# Patient Record
Sex: Female | Born: 1935
Health system: Southern US, Community
[De-identification: ages and names within clinical notes are randomized; demographics above are authoritative.]

## PROBLEM LIST (undated history)

## (undated) DIAGNOSIS — I10 Essential (primary) hypertension: Secondary | ICD-10-CM

## (undated) DIAGNOSIS — R413 Other amnesia: Secondary | ICD-10-CM

## (undated) DIAGNOSIS — K219 Gastro-esophageal reflux disease without esophagitis: Secondary | ICD-10-CM

## (undated) HISTORY — DX: Essential (primary) hypertension: I10

## (undated) HISTORY — DX: Gastro-esophageal reflux disease without esophagitis: K21.9

## (undated) HISTORY — DX: Other amnesia: R41.3

## (undated) HISTORY — PX: CHOLECYSTECTOMY: SHX55

---

## 2004-07-30 ENCOUNTER — Ambulatory Visit: Payer: Self-pay | Admitting: Unknown Physician Specialty

## 2004-08-02 ENCOUNTER — Ambulatory Visit: Payer: Self-pay | Admitting: Unknown Physician Specialty

## 2005-01-03 ENCOUNTER — Ambulatory Visit: Payer: Self-pay | Admitting: Specialist

## 2005-02-21 ENCOUNTER — Ambulatory Visit: Payer: Self-pay | Admitting: Specialist

## 2005-03-27 ENCOUNTER — Ambulatory Visit: Payer: Self-pay | Admitting: General Surgery

## 2005-04-23 ENCOUNTER — Ambulatory Visit: Payer: Self-pay | Admitting: General Surgery

## 2005-05-03 ENCOUNTER — Ambulatory Visit: Payer: Self-pay | Admitting: General Surgery

## 2005-05-10 ENCOUNTER — Ambulatory Visit: Payer: Self-pay | Admitting: General Surgery

## 2005-09-22 ENCOUNTER — Emergency Department: Payer: Self-pay | Admitting: Emergency Medicine

## 2008-04-22 HISTORY — PX: COLONOSCOPY: SHX174

## 2008-06-23 ENCOUNTER — Emergency Department: Payer: Self-pay | Admitting: Emergency Medicine

## 2009-02-28 ENCOUNTER — Ambulatory Visit: Payer: Self-pay | Admitting: Unknown Physician Specialty

## 2009-03-01 ENCOUNTER — Ambulatory Visit: Payer: Self-pay | Admitting: Unknown Physician Specialty

## 2009-08-16 ENCOUNTER — Ambulatory Visit: Payer: Self-pay | Admitting: Family Medicine

## 2009-10-20 ENCOUNTER — Ambulatory Visit: Payer: Self-pay | Admitting: Specialist

## 2010-04-22 HISTORY — PX: HERNIA REPAIR: SHX51

## 2010-05-02 ENCOUNTER — Ambulatory Visit: Payer: Self-pay | Admitting: Specialist

## 2010-08-14 ENCOUNTER — Emergency Department: Payer: Self-pay | Admitting: Emergency Medicine

## 2010-10-28 ENCOUNTER — Observation Stay: Payer: Self-pay | Admitting: Internal Medicine

## 2010-11-08 ENCOUNTER — Ambulatory Visit: Payer: Self-pay | Admitting: General Surgery

## 2010-11-14 ENCOUNTER — Inpatient Hospital Stay: Payer: Self-pay | Admitting: General Surgery

## 2010-11-23 ENCOUNTER — Observation Stay: Payer: Self-pay | Admitting: General Surgery

## 2010-12-10 ENCOUNTER — Ambulatory Visit: Payer: Self-pay | Admitting: General Surgery

## 2012-08-10 ENCOUNTER — Ambulatory Visit: Payer: Self-pay

## 2013-03-15 ENCOUNTER — Ambulatory Visit: Payer: Self-pay | Admitting: Family Medicine

## 2013-03-16 ENCOUNTER — Ambulatory Visit: Payer: Self-pay | Admitting: Internal Medicine

## 2013-12-08 ENCOUNTER — Emergency Department: Payer: Self-pay | Admitting: Internal Medicine

## 2013-12-08 LAB — COMPREHENSIVE METABOLIC PANEL
ALT: 18 U/L
AST: 26 U/L (ref 15–37)
Albumin: 3.5 g/dL (ref 3.4–5.0)
Alkaline Phosphatase: 62 U/L
Anion Gap: 12 (ref 7–16)
BUN: 10 mg/dL (ref 7–18)
Bilirubin,Total: 0.5 mg/dL (ref 0.2–1.0)
CHLORIDE: 103 mmol/L (ref 98–107)
Calcium, Total: 8.3 mg/dL — ABNORMAL LOW (ref 8.5–10.1)
Co2: 27 mmol/L (ref 21–32)
Creatinine: 0.93 mg/dL (ref 0.60–1.30)
EGFR (Non-African Amer.): 57 — ABNORMAL LOW
Glucose: 100 mg/dL — ABNORMAL HIGH (ref 65–99)
Osmolality: 282 (ref 275–301)
Potassium: 3.4 mmol/L — ABNORMAL LOW (ref 3.5–5.1)
Sodium: 142 mmol/L (ref 136–145)
TOTAL PROTEIN: 7 g/dL (ref 6.4–8.2)

## 2013-12-08 LAB — CBC
HCT: 40 % (ref 35.0–47.0)
HGB: 12.4 g/dL (ref 12.0–16.0)
MCH: 25.8 pg — AB (ref 26.0–34.0)
MCHC: 30.9 g/dL — ABNORMAL LOW (ref 32.0–36.0)
MCV: 84 fL (ref 80–100)
PLATELETS: 334 10*3/uL (ref 150–440)
RBC: 4.79 10*6/uL (ref 3.80–5.20)
RDW: 14.3 % (ref 11.5–14.5)
WBC: 7.6 10*3/uL (ref 3.6–11.0)

## 2013-12-08 LAB — URINALYSIS, COMPLETE
BACTERIA: NONE SEEN
BLOOD: NEGATIVE
Bilirubin,UR: NEGATIVE
Glucose,UR: NEGATIVE mg/dL (ref 0–75)
LEUKOCYTE ESTERASE: NEGATIVE
Nitrite: NEGATIVE
PH: 5 (ref 4.5–8.0)
PROTEIN: NEGATIVE
Specific Gravity: 1.019 (ref 1.003–1.030)
Squamous Epithelial: <1

## 2013-12-08 LAB — LIPASE, BLOOD: Lipase: 89 U/L (ref 73–393)

## 2013-12-08 LAB — TROPONIN I

## 2013-12-10 ENCOUNTER — Encounter: Payer: Self-pay | Admitting: General Surgery

## 2013-12-13 ENCOUNTER — Encounter: Payer: Self-pay | Admitting: General Surgery

## 2013-12-13 ENCOUNTER — Ambulatory Visit (INDEPENDENT_AMBULATORY_CARE_PROVIDER_SITE_OTHER): Payer: Medicare Other | Admitting: General Surgery

## 2013-12-13 VITALS — BP 134/78 | HR 70 | Resp 14 | Ht 66.0 in | Wt 194.0 lb

## 2013-12-13 DIAGNOSIS — R112 Nausea with vomiting, unspecified: Secondary | ICD-10-CM

## 2013-12-13 DIAGNOSIS — K5732 Diverticulitis of large intestine without perforation or abscess without bleeding: Secondary | ICD-10-CM

## 2013-12-13 MED ORDER — METOCLOPRAMIDE HCL 5 MG PO TABS: 5.0000 mg | ORAL_TABLET | Freq: Four times a day (QID) | ORAL | Status: DC

## 2013-12-13 NOTE — Patient Instructions (Signed)
Patient to call us on Wednesday to report how she is feeling

## 2013-12-13 NOTE — Progress Notes (Signed)
aPatient ID: Kathy Hernandez, female   DOB: 08/22/1929, 80 y.o.   MRN: [id number]  Chief Complaint  Patient presents with  . Other    Evaluation for diverticulitis    HPI Kathy Hernandez is a 80 y.o. female here today for a evaluation diverticulitis . Patient was seen in the ER on 12/08/13. Patient states she has lost 10 pounds in the last 5 weeks. She sates she is nausea thought out the day and vomits in the morning and dry heaves.  She is accompanied by her close friend, Ms. Edwards.  The patient had not had any significant abdominal complaints since repair of her paraesophageal hernia in 2012. Her recent episode of nausea and vomiting has not been associated with a significant component of abdominal pain.  She was placed on Cipro and Flagyl through the emergency department, which is aggravated her nausea.  Emergency department notes dated December 08, 2013 record a three-week history of intermittent nausea vomiting without abdominal pain or diarrhea. During this time the patient reported she was having normal bowel function.      HPI  Past Medical History  Diagnosis Date  . Hypertension   . GERD (gastroesophageal reflux disease)   . Memory loss     Past Surgical History  Procedure Laterality Date  . Cholecystectomy    . Hernia repair  2012  . Colonoscopy  2010    No family history on file.  Social History History  Substance Use Topics  . Smoking status: Never Smoker   . Smokeless tobacco: Never Used  . Alcohol Use: No    No Known Allergies  Current Outpatient Prescriptions  Medication Sig Dispense Refill  . aspirin 81 MG tablet Take 1 tablet by mouth daily.      . Calcium Carbonate (CALCIUM 600 PO) Take by mouth.      . ciprofloxacin (CIPRO) 500 MG tablet       . donepezil (ARICEPT) 10 MG tablet Take 1 tablet by mouth daily.      Marland Kitchen esomeprazole (NEXIUM) 40 MG capsule Take 1 capsule by mouth daily.      Marland Kitchen lisinopril (PRINIVIL,ZESTRIL) 20 MG tablet       .  metoCLOPramide (REGLAN) 5 MG tablet Take 1 tablet (5 mg total) by mouth 4 (four) times daily.  60 tablet  1  . metroNIDAZOLE (FLAGYL) 250 MG tablet       . NEXIUM 40 MG capsule       . promethazine (PHENERGAN) 25 MG tablet Take 25 mg by mouth every 6 (six) hours as needed for nausea or vomiting.       No current facility-administered medications for this visit.    Review of Systems Review of Systems  Constitutional: Negative.   Respiratory: Negative.   Cardiovascular: Negative.   Gastrointestinal: Positive for nausea, vomiting and diarrhea. Negative for abdominal pain, constipation and anal bleeding.    Blood pressure 134/78, pulse 70, resp. rate 14, height  (1.676 m), weight 194 lb (87.998 kg). Postural blood pressures and pulse were obtained.   In the supine position blood pressure is 132/84 with a pulse of 90.  Seated: 130/80 with a pulse of 103.  Standing 132/80 with a pulse of 94. Minimal postural changes.   The patient's weight at the time of her January 29, 2011 exam was 193 pounds.  Physical Exam Physical Exam  Constitutional: She is oriented to person, place, and time. She appears well-developed and well-nourished.  Eyes: Conjunctivae are normal.  No scleral icterus.  Neck: Neck supple.  Cardiovascular: Normal rate, regular rhythm and normal heart sounds.   Pulmonary/Chest: Effort normal and breath sounds normal.  Abdominal: Soft. Normal appearance and bowel sounds are normal. There is tenderness in the epigastric area.  Neurological: She is alert and oriented to person, place, and time.  Skin: Skin is warm and dry.    Data Reviewed Laboratory studies obtained in the emergency department on December 08, 2013 showed normal liver function studies with a albumin of 3.5, normal alkaline phosphatase. Mild depression of the serum sodium 3.4, mild pressures calcium 8.3, hemoglobin of 12.4 with an MCV of 84 and white blood cell count 7600.  Urinalysis was essentially  negative.  CT scan of the abdomen and pelvis dated December 08, 2013 was reviewed. Very mild sigmoid diverticulitis with fat stranding in the left lower quadrant was appreciated. No evidence of obstruction.  Assessment    Nausea and vomiting without significant findings to suggest diverticulitis on clinical grounds.     Plan    The patient has been asked to discontinue her antibiotics, as they seem to be worsening her nausea. She'll be placed on Reglan, 5 mg 30 minutes before meals and at bedtime. My hope is that with control of her nausea she'll be able to maintain adequate hydration and nutrition without hospitalization.  The patient has been asked to contact the office by new on Wednesday, August 26 to report her progress. If she is not improved will plan for hospital admission.     PCP: Deliah Boston 12/14/2013, 12:52 PM

## 2013-12-14 DIAGNOSIS — K5732 Diverticulitis of large intestine without perforation or abscess without bleeding: Secondary | ICD-10-CM | POA: Insufficient documentation

## 2013-12-14 DIAGNOSIS — R112 Nausea with vomiting, unspecified: Secondary | ICD-10-CM | POA: Insufficient documentation

## 2013-12-15 ENCOUNTER — Telehealth: Payer: Self-pay | Admitting: *Deleted

## 2013-12-15 NOTE — Telephone Encounter (Signed)
Patient has been scheduled for an abdominal ultrasound and UGI/SBFT at [facility] for 12-16-13. This patient is to arrive at 8:45 am and check-in at the [facility] registration desk (first desk on right). Abdominal ultrasound is scheduled for 9 am and UGI/SBFT at 11 am. Prep: NPO after midnight. Patient is aware of date, time, and instructions. She verbalizes understanding.

## 2013-12-15 NOTE — Telephone Encounter (Signed)
Message copied by Currie Paris on Wed Dec 15, 2013 10:12 AM ------      Message from: Earline Mayotte      Created: Tue Dec 14, 2013 12:59 PM       This patient was seen Monday, August 24 with weeks of nausea and vomiting. She has been placed on Reglan and instructed to call by noon on Wednesday, August 26. Please contact her if she is not called by that time. ------

## 2013-12-15 NOTE — Telephone Encounter (Signed)
I talked with the patient. She states she is still nauseated, but no vomiting. She has a poor appetite and eating "very little". Example of yesterday meals, 2-3 crackers for breakfast, 1/2 cheese sandwich for lunch and no supper. She states she feels weak.  I told her I would let Dr. Lemar Livings know and call her back.

## 2013-12-16 ENCOUNTER — Ambulatory Visit: Payer: Self-pay | Admitting: General Surgery

## 2013-12-16 ENCOUNTER — Encounter: Payer: Self-pay | Admitting: General Surgery

## 2013-12-21 ENCOUNTER — Telehealth: Payer: Self-pay | Admitting: *Deleted

## 2013-12-21 NOTE — Telephone Encounter (Signed)
Message copied by Currie Paris on Tue Dec 21, 2013 11:53 AM ------      Message from: Earline Mayotte      Created: Tue Dec 21, 2013  8:59 AM       Notify patient U/S OK, UGI: No blockages.  See how she is doing (has had nausea w/o vomiting).  Arrange f/u in next 1-2 weeks if not significantly improved. Thanks. ------

## 2013-12-21 NOTE — Telephone Encounter (Signed)
Pt is feeling better has her days, I made her a f/u appt in 2 weeks and if she feels even better she will call and cancel. Pt is aware of u/s results

## 2013-12-22 NOTE — Telephone Encounter (Signed)
Notified of results, pt pleased. I talked with the patient and she said that for the past couple of days she has actually felt better. She wasn't sure at what times she was taking the Reglan, so reviewed that with her to take 30 minutes before meals  And then at bedtime, she agrees. F/U has already been scheduled for 01-10-14.

## 2014-01-04 ENCOUNTER — Ambulatory Visit: Payer: Self-pay | Admitting: Family Medicine

## 2014-01-10 ENCOUNTER — Encounter: Payer: Self-pay | Admitting: General Surgery

## 2014-01-10 ENCOUNTER — Ambulatory Visit (INDEPENDENT_AMBULATORY_CARE_PROVIDER_SITE_OTHER): Payer: Medicare Other | Admitting: General Surgery

## 2014-01-10 VITALS — BP 140/80 | HR 78 | Resp 14 | Ht 66.0 in | Wt 195.0 lb

## 2014-01-10 DIAGNOSIS — R112 Nausea with vomiting, unspecified: Secondary | ICD-10-CM

## 2014-01-10 NOTE — Patient Instructions (Addendum)
Patient to decrease Reglan to 3 times a day with meals for three weeks then decrease to 2 times daily then 1 time daily. The patient is aware to call back for any questions or concerns.

## 2014-01-10 NOTE — Progress Notes (Signed)
Patient ID: Kathy Hernandez, female   DOB: 08/10/1929, 80 y.o.   MRN: [id number]  Chief Complaint  Patient presents with  . Follow-up    follow up nausea and vomiting    HPI Kathy Hernandez is a 80 y.o. female who presents for a follow up visit of nausea and vomiting. The patient states she had 1 episode of nausea, vomiting, and diarrhea on 12/30/13. She has completed her antibiotics. She denies pain at this time. Patient states she is avoiding certain foods at this time such as salads. She also states she has been having occasional black stools but this was after the episode of N/V/D treated with Pepto Bismol.  In general, she reports feeling much better.  HPI  Past Medical History  Diagnosis Date  . Hypertension   . GERD (gastroesophageal reflux disease)   . Memory loss     Past Surgical History  Procedure Laterality Date  . Cholecystectomy    . Hernia repair  2012  . Colonoscopy  2010    No family history on file.  Social History History  Substance Use Topics  . Smoking status: Never Smoker   . Smokeless tobacco: Never Used  . Alcohol Use: No    No Known Allergies  Current Outpatient Prescriptions  Medication Sig Dispense Refill  . aspirin 81 MG tablet Take 1 tablet by mouth daily.      . Calcium Carbonate (CALCIUM 600 PO) Take by mouth.      . donepezil (ARICEPT) 10 MG tablet Take 1 tablet by mouth daily.      Marland Kitchen esomeprazole (NEXIUM) 40 MG capsule Take 1 capsule by mouth daily.      Marland Kitchen lisinopril (PRINIVIL,ZESTRIL) 20 MG tablet       . metoCLOPramide (REGLAN) 5 MG tablet Take 1 tablet (5 mg total) by mouth 4 (four) times daily.  60 tablet  1  . NEXIUM 40 MG capsule       . promethazine (PHENERGAN) 25 MG tablet Take 25 mg by mouth every 6 (six) hours as needed for nausea or vomiting.       No current facility-administered medications for this visit.    Review of Systems Review of Systems  Constitutional: Negative.   Respiratory: Negative.   Cardiovascular:  Negative.   Gastrointestinal: Negative.     Blood pressure 140/80, pulse 78, resp. rate 14, height  (1.676 m), weight 195 lb (88.451 kg). The patient's weight is up 1 pound from her last visit.  Physical Exam Physical Exam  Constitutional: She is oriented to person, place, and time. She appears well-developed and well-nourished.  Cardiovascular: Normal rate, regular rhythm and normal heart sounds.   No murmur heard. Pulmonary/Chest: Effort normal and breath sounds normal.  Abdominal: Soft. Normal appearance and bowel sounds are normal. There is no hepatosplenomegaly. There is no tenderness. No hernia.  Neurological: She is alert and oriented to person, place, and time.  Skin: Skin is warm and dry.    Data Reviewed Upper GI series showed no evidence of obstruction. Upper abdominal ultrasound was negative. (Status postcholecystectomy).  Assessment    Marked improvement in postprandial abdominal symptoms. Etiology unclear.     Plan    The patient will call should she experience new problems, follow up otherwise will be on an as needed basis.      PCP: Deliah Boston 01/11/2014, 6:52 AM

## 2014-02-08 ENCOUNTER — Telehealth: Payer: Self-pay

## 2014-02-08 NOTE — Telephone Encounter (Signed)
The patient had been instructed to wean off of her Reglan at the time of her September 2015 visit. Apparently this is not going to be entirely possible. She'll be placed on Reglan, 5 mg T.i.d. Until her symptoms have resolved, and then decrease to a b.i.d. Schedule indefinitely.

## 2014-02-08 NOTE — Telephone Encounter (Signed)
Patient called and said that she had used all the stomach nausea medication as prescribed and had done well with the reduction. She has been out for the past 3 days and now today is having more nausea and would like a refill of the Reglan that was prescribed.

## 2014-02-09 MED ORDER — METOCLOPRAMIDE HCL 5 MG PO TABS: 5.0000 mg | ORAL_TABLET | Freq: Three times a day (TID) | ORAL | Status: DC

## 2014-02-09 NOTE — Addendum Note (Signed)
Addended by: Currie ParisHATCH, Jermario Kalmar M on: 02/09/2014 11:29 AM   Modules accepted: Orders

## 2014-02-09 NOTE — Telephone Encounter (Signed)
Pt aware of RX and instructions.

## 2015-04-26 ENCOUNTER — Emergency Department: Payer: PPO

## 2015-04-26 ENCOUNTER — Emergency Department
Admission: EM | Admit: 2015-04-26 | Discharge: 2015-04-26 | Disposition: A | Discharge status: Home / Self Care | Payer: PPO | Attending: Emergency Medicine | Admitting: Emergency Medicine

## 2015-04-26 ENCOUNTER — Encounter: Payer: Self-pay | Admitting: Emergency Medicine

## 2015-04-26 DIAGNOSIS — I10 Essential (primary) hypertension: Secondary | ICD-10-CM | POA: Insufficient documentation

## 2015-04-26 DIAGNOSIS — N3090 Cystitis, unspecified without hematuria: Secondary | ICD-10-CM | POA: Diagnosis not present

## 2015-04-26 DIAGNOSIS — R410 Disorientation, unspecified: Secondary | ICD-10-CM | POA: Diagnosis not present

## 2015-04-26 DIAGNOSIS — Z79899 Other long term (current) drug therapy: Secondary | ICD-10-CM | POA: Insufficient documentation

## 2015-04-26 DIAGNOSIS — R4182 Altered mental status, unspecified: Secondary | ICD-10-CM | POA: Diagnosis not present

## 2015-04-26 DIAGNOSIS — F0390 Unspecified dementia without behavioral disturbance: Secondary | ICD-10-CM | POA: Insufficient documentation

## 2015-04-26 DIAGNOSIS — R4781 Slurred speech: Secondary | ICD-10-CM | POA: Diagnosis not present

## 2015-04-26 DIAGNOSIS — Z7982 Long term (current) use of aspirin: Secondary | ICD-10-CM | POA: Insufficient documentation

## 2015-04-26 LAB — COMPREHENSIVE METABOLIC PANEL
ALK PHOS: 64 U/L (ref 38–126)
ALT: 10 U/L — AB (ref 14–54)
ANION GAP: 7 (ref 5–15)
AST: 16 U/L (ref 15–41)
Albumin: 4 g/dL (ref 3.5–5.0)
BILIRUBIN TOTAL: 1 mg/dL (ref 0.3–1.2)
BUN: 13 mg/dL (ref 6–20)
CALCIUM: 9.3 mg/dL (ref 8.9–10.3)
CO2: 28 mmol/L (ref 22–32)
CREATININE: 0.84 mg/dL (ref 0.44–1.00)
Chloride: 103 mmol/L (ref 101–111)
GFR calc non Af Amer: >60 mL/min (ref >60)
Glucose, Bld: 109 mg/dL — ABNORMAL HIGH (ref 65–99)
Potassium: 4 mmol/L (ref 3.5–5.1)
SODIUM: 138 mmol/L (ref 135–145)
TOTAL PROTEIN: 7 g/dL (ref 6.5–8.1)

## 2015-04-26 LAB — URINALYSIS COMPLETE WITH MICROSCOPIC (ARMC ONLY)
Bilirubin Urine: NEGATIVE
GLUCOSE, UA: NEGATIVE mg/dL
NITRITE: NEGATIVE
PROTEIN: NEGATIVE mg/dL
Specific Gravity, Urine: 1.014 (ref 1.005–1.030)
pH: 6 (ref 5.0–8.0)

## 2015-04-26 LAB — PROTIME-INR
INR: 1.01
PROTHROMBIN TIME: 13.5 s (ref 11.4–15.0)

## 2015-04-26 LAB — DIFFERENTIAL
Basophils Absolute: 0.1 10*3/uL (ref 0–0.1)
Basophils Relative: 1 %
EOS PCT: 1 %
Eosinophils Absolute: 0.1 10*3/uL (ref 0–0.7)
LYMPHS ABS: 1.4 10*3/uL (ref 1.0–3.6)
LYMPHS PCT: 17 %
MONO ABS: 0.2 10*3/uL (ref 0.2–0.9)
MONOS PCT: 3 %
NEUTROS ABS: 6.1 10*3/uL (ref 1.4–6.5)
Neutrophils Relative %: 78 %

## 2015-04-26 LAB — CBC
HEMATOCRIT: 39.4 % (ref 35.0–47.0)
HEMOGLOBIN: 12.9 g/dL (ref 12.0–16.0)
MCH: 26.3 pg (ref 26.0–34.0)
MCHC: 32.7 g/dL (ref 32.0–36.0)
MCV: 80.5 fL (ref 80.0–100.0)
PLATELETS: 295 10*3/uL (ref 150–440)
RBC: 4.89 MIL/uL (ref 3.80–5.20)
RDW: 14.4 % (ref 11.5–14.5)
WBC: 7.9 10*3/uL (ref 3.6–11.0)

## 2015-04-26 LAB — APTT: aPTT: 30 seconds (ref 24–36)

## 2015-04-26 LAB — TROPONIN I: Troponin I: <0.03 ng/mL (ref <0.031)

## 2015-04-26 MED ORDER — ONDANSETRON HCL 4 MG/2ML IJ SOLN
4.0000 mg | Freq: Once | INTRAMUSCULAR | Status: AC
  Administered 2015-04-26: 4 mg via INTRAVENOUS

## 2015-04-26 MED ORDER — SODIUM CHLORIDE 0.9 % IV BOLUS (SEPSIS)
1000.0000 mL | Freq: Once | INTRAVENOUS | Status: AC
  Administered 2015-04-26: 1000 mL via INTRAVENOUS

## 2015-04-26 MED ORDER — CEPHALEXIN 500 MG PO CAPS: 500.0000 mg | ORAL_CAPSULE | Freq: Three times a day (TID) | ORAL | Status: DC

## 2015-04-26 MED ORDER — ACETAMINOPHEN 325 MG PO TABS
650.0000 mg | ORAL_TABLET | Freq: Once | ORAL | Status: AC
  Administered 2015-04-26: 650 mg via ORAL
  Filled 2015-04-26: qty 2

## 2015-04-26 MED ORDER — ONDANSETRON HCL 4 MG/2ML IJ SOLN
INTRAMUSCULAR | Status: AC
  Administered 2015-04-26: 4 mg via INTRAVENOUS
  Filled 2015-04-26: qty 2

## 2015-04-26 NOTE — ED Notes (Signed)
Pt from home via [facility] with possible stroke, EMS reports negative stroke test. VSS 189/112, HR 79, 96% RA, CBG 101. Pt reports slurred speech and confusion, LKW was 2300 yesterday. Pt alert and oriented upon arrival to ED. MD to bedside.

## 2015-04-26 NOTE — ED Provider Notes (Signed)
[facility] Emergency Department Provider Note  ____________________________________________  Time seen: 9:05 am  I have reviewed the triage vital signs and the nursing notes.   HISTORY  Chief Complaint Stroke Symptoms  history Limited by chronic dementia patient inability to relate a history   HPI Kathy Hernandez is a 82 y.o. female sent to the ED due to some confusion this morning. The patient is unable to report any symptoms with states that she does feel sick. Family report that she gets confused time to time and has chronic dementia, but this morning she seemed a little bit more confused than usual. No weakness or falls. Report that she may have set had some slurred speech but that was resolved on arrival by EMS according to EMS.     Past Medical History  Diagnosis Date  . Hypertension   . GERD (gastroesophageal reflux disease)   . Memory loss      Patient Active Problem List   Diagnosis Date Noted  . Nausea with vomiting 12/14/2013  . Diverticulitis of colon (without mention of hemorrhage) 12/14/2013     Past Surgical History  Procedure Laterality Date  . Cholecystectomy    . Hernia repair  2012  . Colonoscopy  2010     Current Outpatient Rx  Name  Route  Sig  Dispense  Refill  . aspirin 81 MG tablet   Oral   Take 1 tablet by mouth daily.         . Calcium Carbonate (CALCIUM 600 PO)   Oral   Take 1 tablet by mouth daily.          Marland Kitchen. lisinopril (PRINIVIL,ZESTRIL) 20 MG tablet   Oral   Take 20 mg by mouth daily.          . memantine (NAMENDA) 5 MG tablet   Oral   Take 5 mg by mouth 2 (two) times daily.         . metoCLOPramide (REGLAN) 5 MG tablet   Oral   Take 1 tablet (5 mg total) by mouth 3 (three) times daily before meals.   90 tablet   11   . cephALEXin (KEFLEX) 500 MG capsule   Oral   Take 1 capsule (500 mg total) by mouth 3 (three) times daily.   21 capsule   0   . metoCLOPramide (REGLAN) 5 MG  tablet   Oral   Take 1 tablet (5 mg total) by mouth 4 (four) times daily. Patient not taking: Reported on 04/26/2015   60 tablet   1      Allergies Review of patient's allergies indicates no known allergies.   No family history on file.  Social History Social History  Substance Use Topics  . Smoking status: Never Smoker   . Smokeless tobacco: Never Used  . Alcohol Use: No    Review of Systems  Constitutional:   No fever or chills. No weight changes Eyes:   No blurry vision or double vision.  ENT:   No sore throat. Cardiovascular:   No chest pain. Respiratory:   No dyspnea or cough. Gastrointestinal:   Negative for abdominal pain, vomiting and diarrhea.  No BRBPR or melena. Genitourinary:   Negative for dysuria, urinary retention, bloody urine, or difficulty urinating. Musculoskeletal:   Negative for back pain. No joint swelling or pain. Skin:   Negative for rash. Neurological:   Negative for headaches, focal weakness or numbness. Psychiatric:  No anxiety or depression.   Endocrine:  No hot/cold intolerance, changes in energy, or sleep difficulty.  10-point ROS otherwise negative.  ____________________________________________   PHYSICAL EXAM:  VITAL SIGNS: ED Triage Vitals  Enc Vitals Group     BP --      Pulse --      Resp --      Temp 04/26/15 0907 99.4 F (37.4 C)     Temp Source 04/26/15 0907 Oral     SpO2 --      Weight 04/26/15 0907 190 lb (86.183 kg)     Height 04/26/15 0907 5\' 6"  (1.676 m)     Head Cir --      Peak Flow --      Pain Score 04/26/15 0907 0     Pain Loc --      Pain Edu? --      Excl. in GC? --     Vital signs reviewed, nursing assessments reviewed.   Constitutional:   Alert and oriented 3. Well appearing and in no distress. Eyes:   No scleral icterus. No conjunctival pallor. PERRL. EOMI ENT   Head:   Normocephalic and atraumatic.   Nose:   No congestion/rhinnorhea. No septal hematoma   Mouth/Throat:   Dry mucous  membranes, no pharyngeal erythema. No peritonsillar mass. No uvula shift.   Neck:   No stridor. No SubQ emphysema. No meningismus. Hematological/Lymphatic/Immunilogical:   No cervical lymphadenopathy. Cardiovascular:   RRR. Normal and symmetric distal pulses are present in all extremities. No murmurs, rubs, or gallops. Respiratory:   Normal respiratory effort without tachypnea nor retractions. Breath sounds are clear and equal bilaterally. No wheezes/rales/rhonchi. Gastrointestinal:   Soft and nontender. No distention. There is no CVA tenderness.  No rebound, rigidity, or guarding. Genitourinary:   deferred Musculoskeletal:   Nontender with normal range of motion in all extremities. No joint effusions.  No lower extremity tenderness.  No edema. Neurologic:   Normal speech and language.  CN 2-10 normal. Motor grossly intact. No pronator drift. No gross focal neurologic deficits are appreciated.  Skin:    Skin is warm, dry and intact. No rash noted.  No petechiae, purpura, or bullae. Psychiatric:   Mood and affect are normal. Speech and behavior are normal. Patient exhibits appropriate insight and judgment.  ____________________________________________    LABS (pertinent positives/negatives) (all labs ordered are listed, but only abnormal results are displayed) Labs Reviewed  COMPREHENSIVE METABOLIC PANEL - Abnormal; Notable for the following:    Glucose, Bld 109 (*)    ALT 10 (*)    All other components within normal limits  URINALYSIS COMPLETEWITH MICROSCOPIC ([facility] ONLY) - Abnormal; Notable for the following:    Color, Urine YELLOW (*)    APPearance HAZY (*)    Ketones, ur TRACE (*)    Hgb urine dipstick 1+ (*)    Leukocytes, UA 2+ (*)    Bacteria, UA MANY (*)    Squamous Epithelial / LPF 0-5 (*)    All other components within normal limits  URINE CULTURE  PROTIME-INR  APTT  CBC  DIFFERENTIAL  TROPONIN I  CBG MONITORING, ED    ____________________________________________   EKG  Interpreted by me Normal sinus rhythm rate of 76, normal axis intervals QRS and ST segments and T waves  ____________________________________________    RADIOLOGY  CT head unremarkable   ____________________________________________   PROCEDURES   ____________________________________________   INITIAL IMPRESSION / ASSESSMENT AND PLAN / ED COURSE  Pertinent labs & imaging results that were available during my care of  the patient were reviewed by me and considered in my medical decision making (see chart for details).  Patient presents with some confusion in setting of chronic dementia, now back to her baseline on arrival to the ED according to family who arrived shortly thereafter. Low suspicion for TIA stroke or intracranial hemorrhage. No evidence of sepsis. We'll check CT and labs.  ----------------------------------------- 12:09 PM on 04/26/2015 -----------------------------------------  CT negative, labs unremarkable except for a yacht urinalysis showing 2+ leukocytes and a large amount of bacteria. With her low-grade fever of 99, she may be having some mild delirium or exacerbation of her dementia in the setting of an oncoming urinary tract infection. I sent urine culture and will start her on Keflex and have her follow up with primary care. She is otherwise well-appearing no acute distress nontoxic and suitable for discharge and outpatient follow-up.     ____________________________________________   FINAL CLINICAL IMPRESSION(S) / ED DIAGNOSES  Final diagnoses:  Confusion  Cystitis      Sharman Cheek, MD 04/26/15 1209

## 2015-04-26 NOTE — ED Notes (Signed)
Pt discharged home after verbalizing understanding of discharge instructions; nad noted. 

## 2015-04-26 NOTE — Discharge Instructions (Signed)
Confusion °Confusion is the inability to think with your usual speed or clarity. Confusion may come on quickly or slowly over time. How quickly the confusion comes on depends on the cause. Confusion can be due to any number of causes. °CAUSES  °· Concussion, head injury, or head trauma. °· Seizures. °· Stroke. °· Fever. °· Brain tumor. °· Age related decreased brain function (dementia). °· Heightened emotional states like rage or terror. °· Mental illness in which the person loses the ability to determine what is real and what is not (hallucinations). °· Infections such as a urinary tract infection (UTI). °· Toxic effects from alcohol, drugs, or prescription medicines. °· Dehydration and an imbalance of salts in the body (electrolytes). °· Lack of sleep. °· Low blood sugar (diabetes). °· Low levels of oxygen from conditions such as chronic lung disorders. °· Drug interactions or other medicine side effects. °· Nutritional deficiencies, especially niacin, thiamine, vitamin C, or vitamin B. °· Sudden drop in body temperature (hypothermia). °· Change in routine, such as when traveling or hospitalized. °SIGNS AND SYMPTOMS  °People often describe their thinking as cloudy or unclear when they are confused. Confusion can also include feeling disoriented. That means you are unaware of where or who you are. You may also not know what the date or time is. If confused, you may also have difficulty paying attention, remembering, and making decisions. Some people also act aggressively when they are confused.  °DIAGNOSIS  °The medical evaluation of confusion may include: °· Blood and urine tests. °· X-rays. °· Brain and nervous system tests. °· Analyzing your brain waves (electroencephalogram or EEG). °· Magnetic resonance imaging (MRI) of your head. °· Computed tomography (CT) scan of your head. °· Mental status tests in which your health care provider may ask many questions. Some of these questions may seem silly or strange,  but they are a very important test to help diagnose and treat confusion. °TREATMENT  °An admission to the hospital may not be needed, but a person with confusion should not be left alone. Stay with a family member or friend until the confusion clears. Avoid alcohol, pain relievers, or sedative drugs until you have fully recovered. Do not drive until directed by your health care provider. °HOME CARE INSTRUCTIONS  °What family and friends can do: °· To find out if someone is confused, ask the person to state his or her name, age, and the date. If the person is unsure or answers incorrectly, he or she is confused. °· Always introduce yourself, no matter how well the person knows you. °· Often remind the person of his or her location. °· Place a calendar and clock near the confused person. °· Help the person with his or her medicines. You may want to use a pill box, an alarm as a reminder, or give the person each dose as prescribed. °· Talk about current events and plans for the day. °· Try to keep the environment calm, quiet, and peaceful. °· Make sure the person keeps follow-up visits with his or her health care provider. °PREVENTION  °Ways to prevent confusion: °· Avoid alcohol. °· Eat a balanced diet. °· Get enough sleep. °· Take medicine only as directed by your health care provider. °· Do not become isolated. Spend time with other people and make plans for your days. °· Keep careful watch on your blood sugar levels if you are diabetic. °SEEK IMMEDIATE MEDICAL CARE IF:  °· You develop severe headaches, repeated vomiting, seizures, blackouts, or   slurred speech. °· There is increasing confusion, weakness, numbness, restlessness, or personality changes. °· You develop a loss of balance, have marked dizziness, feel uncoordinated, or fall. °· You have delusions, hallucinations, or develop severe anxiety. °· Your family members think you need to be rechecked. °  °This information is not intended to replace advice given  to you by your health care provider. Make sure you discuss any questions you have with your health care provider. °  °Document Released: 05/16/2004 Document Revised: 04/29/2014 Document Reviewed: 05/14/2013 °Elsevier Interactive Patient Education ©2016 [employer] °Urinary Tract Infection °Urinary tract infections (UTIs) can develop anywhere along your urinary tract. Your urinary tract is your body's drainage system for removing wastes and extra water. Your urinary tract includes two kidneys, two ureters, a bladder, and a urethra. Your kidneys are a pair of bean-shaped organs. Each kidney is about the size of your fist. They are located below your ribs, one on each side of your spine. °CAUSES °Infections are caused by microbes, which are microscopic organisms, including fungi, viruses, and bacteria. These organisms are so small that they can only be seen through a microscope. Bacteria are the microbes that most commonly cause UTIs. °SYMPTOMS  °Symptoms of UTIs may vary by age and gender of the patient and by the location of the infection. Symptoms in young women typically include a frequent and intense urge to urinate and a painful, burning feeling in the bladder or urethra during urination. Older women and men are more likely to be tired, shaky, and weak and have muscle aches and abdominal pain. A fever may mean the infection is in your kidneys. Other symptoms of a kidney infection include pain in your back or sides below the ribs, nausea, and vomiting. °DIAGNOSIS °To diagnose a UTI, your caregiver will ask you about your symptoms. Your caregiver will also ask you to provide a urine sample. The urine sample will be tested for bacteria and white blood cells. White blood cells are made by your body to help fight infection. °TREATMENT  °Typically, UTIs can be treated with medication. Because most UTIs are caused by a bacterial infection, they usually can be treated with the use of antibiotics. The choice of  antibiotic and length of treatment depend on your symptoms and the type of bacteria causing your infection. °HOME CARE INSTRUCTIONS °· If you were prescribed antibiotics, take them exactly as your caregiver instructs you. Finish the medication even if you feel better after you have only taken some of the medication. °· Drink enough water and fluids to keep your urine clear or pale yellow. °· Avoid caffeine, tea, and carbonated beverages. They tend to irritate your bladder. °· Empty your bladder often. Avoid holding urine for long periods of time. °· Empty your bladder before and after sexual intercourse. °· After a bowel movement, women should cleanse from front to back. Use each tissue only once. °SEEK MEDICAL CARE IF:  °· You have back pain. °· You develop a fever. °· Your symptoms do not begin to resolve within 3 days. °SEEK IMMEDIATE MEDICAL CARE IF:  °· You have severe back pain or lower abdominal pain. °· You develop chills. °· You have nausea or vomiting. °· You have continued burning or discomfort with urination. °MAKE SURE YOU:  °· Understand these instructions. °· Will watch your condition. °· Will get help right away if you are not doing well or get worse. °  °This information is not intended to replace advice given to you by   your health care provider. Make sure you discuss any questions you have with your health care provider. °  °Document Released: 01/16/2005 Document Revised: 12/28/2014 Document Reviewed: 05/17/2011 °Elsevier Interactive Patient Education ©2016 [employer] ° °

## 2015-04-28 LAB — URINE CULTURE: Culture: >=100000

## 2015-05-04 DIAGNOSIS — Z87440 Personal history of urinary (tract) infections: Secondary | ICD-10-CM | POA: Diagnosis not present

## 2015-05-04 DIAGNOSIS — G308 Other Alzheimer's disease: Secondary | ICD-10-CM | POA: Diagnosis not present

## 2015-05-04 DIAGNOSIS — E538 Deficiency of other specified B group vitamins: Secondary | ICD-10-CM | POA: Diagnosis not present

## 2015-05-04 DIAGNOSIS — F0280 Dementia in other diseases classified elsewhere without behavioral disturbance: Secondary | ICD-10-CM | POA: Diagnosis not present

## 2015-05-05 ENCOUNTER — Emergency Department
Admission: EM | Admit: 2015-05-05 | Discharge: 2015-05-05 | Disposition: A | Discharge status: Home / Self Care | Payer: PPO | Attending: Emergency Medicine | Admitting: Emergency Medicine

## 2015-05-05 ENCOUNTER — Emergency Department: Payer: PPO

## 2015-05-05 ENCOUNTER — Encounter: Payer: Self-pay | Admitting: Emergency Medicine

## 2015-05-05 DIAGNOSIS — R51 Headache: Secondary | ICD-10-CM | POA: Diagnosis not present

## 2015-05-05 DIAGNOSIS — Z7982 Long term (current) use of aspirin: Secondary | ICD-10-CM | POA: Diagnosis not present

## 2015-05-05 DIAGNOSIS — R4781 Slurred speech: Secondary | ICD-10-CM | POA: Diagnosis not present

## 2015-05-05 DIAGNOSIS — I10 Essential (primary) hypertension: Secondary | ICD-10-CM | POA: Insufficient documentation

## 2015-05-05 DIAGNOSIS — G459 Transient cerebral ischemic attack, unspecified: Secondary | ICD-10-CM | POA: Insufficient documentation

## 2015-05-05 DIAGNOSIS — R29700 NIHSS score 0: Secondary | ICD-10-CM | POA: Diagnosis not present

## 2015-05-05 DIAGNOSIS — Z792 Long term (current) use of antibiotics: Secondary | ICD-10-CM | POA: Diagnosis not present

## 2015-05-05 DIAGNOSIS — R29810 Facial weakness: Secondary | ICD-10-CM

## 2015-05-05 DIAGNOSIS — Z79899 Other long term (current) drug therapy: Secondary | ICD-10-CM | POA: Insufficient documentation

## 2015-05-05 DIAGNOSIS — N390 Urinary tract infection, site not specified: Secondary | ICD-10-CM | POA: Insufficient documentation

## 2015-05-05 DIAGNOSIS — I639 Cerebral infarction, unspecified: Secondary | ICD-10-CM

## 2015-05-05 LAB — CBC
HEMATOCRIT: 38.2 % (ref 35.0–47.0)
Hemoglobin: 12.2 g/dL (ref 12.0–16.0)
MCH: 26.5 pg (ref 26.0–34.0)
MCHC: 32 g/dL (ref 32.0–36.0)
MCV: 82.7 fL (ref 80.0–100.0)
PLATELETS: 281 10*3/uL (ref 150–440)
RBC: 4.62 MIL/uL (ref 3.80–5.20)
RDW: 15 % — AB (ref 11.5–14.5)
WBC: 8 10*3/uL (ref 3.6–11.0)

## 2015-05-05 LAB — DIFFERENTIAL
BASOS ABS: 0 10*3/uL (ref 0–0.1)
BASOS PCT: 1 %
Eosinophils Absolute: 0.2 10*3/uL (ref 0–0.7)
Eosinophils Relative: 2 %
Lymphocytes Relative: 27 %
Lymphs Abs: 2.1 10*3/uL (ref 1.0–3.6)
MONOS PCT: 5 %
Monocytes Absolute: 0.4 10*3/uL (ref 0.2–0.9)
NEUTROS ABS: 5.2 10*3/uL (ref 1.4–6.5)
Neutrophils Relative %: 65 %

## 2015-05-05 LAB — COMPREHENSIVE METABOLIC PANEL
ALT: 11 U/L — AB (ref 14–54)
AST: 12 U/L — AB (ref 15–41)
Albumin: 3.5 g/dL (ref 3.5–5.0)
Alkaline Phosphatase: 53 U/L (ref 38–126)
Anion gap: 7 (ref 5–15)
BUN: 16 mg/dL (ref 6–20)
CHLORIDE: 104 mmol/L (ref 101–111)
CO2: 29 mmol/L (ref 22–32)
CREATININE: 0.93 mg/dL (ref 0.44–1.00)
Calcium: 9.3 mg/dL (ref 8.9–10.3)
GFR calc Af Amer: >60 mL/min (ref >60)
GFR calc non Af Amer: 55 mL/min — ABNORMAL LOW (ref >60)
Glucose, Bld: 80 mg/dL (ref 65–99)
Potassium: 3.3 mmol/L — ABNORMAL LOW (ref 3.5–5.1)
SODIUM: 140 mmol/L (ref 135–145)
Total Bilirubin: 0.4 mg/dL (ref 0.3–1.2)
Total Protein: 6.4 g/dL — ABNORMAL LOW (ref 6.5–8.1)

## 2015-05-05 LAB — URINALYSIS COMPLETE WITH MICROSCOPIC (ARMC ONLY)
BILIRUBIN URINE: NEGATIVE
Glucose, UA: NEGATIVE mg/dL
HGB URINE DIPSTICK: NEGATIVE
Nitrite: POSITIVE — AB
PH: 5 (ref 5.0–8.0)
Protein, ur: 30 mg/dL — AB
Specific Gravity, Urine: 1.032 — ABNORMAL HIGH (ref 1.005–1.030)
TRANS EPITHEL UA: 1

## 2015-05-05 LAB — ETHANOL

## 2015-05-05 LAB — PROTIME-INR
INR: 1.01
Prothrombin Time: 13.5 seconds (ref 11.4–15.0)

## 2015-05-05 LAB — TROPONIN I: Troponin I: <0.03 ng/mL (ref <0.031)

## 2015-05-05 LAB — GLUCOSE, CAPILLARY: GLUCOSE-CAPILLARY: 100 mg/dL — AB (ref 65–99)

## 2015-05-05 LAB — APTT: APTT: 26 s (ref 24–36)

## 2015-05-05 MED ORDER — LORAZEPAM 2 MG/ML IJ SOLN
INTRAMUSCULAR | Status: AC
  Administered 2015-05-05: 1 mg via INTRAVENOUS
  Filled 2015-05-05: qty 1

## 2015-05-05 MED ORDER — LORAZEPAM 2 MG/ML IJ SOLN
1.0000 mg | Freq: Once | INTRAMUSCULAR | Status: AC
  Administered 2015-05-05: 1 mg via INTRAVENOUS

## 2015-05-05 MED ORDER — CEFUROXIME AXETIL 500 MG PO TABS
500.0000 mg | ORAL_TABLET | Freq: Once | ORAL | Status: DC
  Filled 2015-05-05: qty 1

## 2015-05-05 MED ORDER — ASPIRIN 81 MG PO CHEW
324.0000 mg | CHEWABLE_TABLET | Freq: Once | ORAL | Status: AC
  Administered 2015-05-05: 324 mg via ORAL
  Filled 2015-05-05: qty 4

## 2015-05-05 MED ORDER — CEFUROXIME AXETIL 500 MG PO TABS
500.0000 mg | ORAL_TABLET | Freq: Once | ORAL | Status: AC
  Administered 2015-05-05: 500 mg via ORAL
  Filled 2015-05-05: qty 1

## 2015-05-05 MED ORDER — CEFUROXIME AXETIL 500 MG PO TABS: 500.0000 mg | ORAL_TABLET | Freq: Two times a day (BID) | ORAL | Status: AC

## 2015-05-05 NOTE — ED Notes (Signed)
CBG 100 

## 2015-05-05 NOTE — ED Notes (Signed)
Right facial droop, right side headache, slurring words, drooling from left side of mouth. Family /friends witnessed symptoms at 14:55

## 2015-05-05 NOTE — ED Notes (Signed)
MD at bedside. 

## 2015-05-05 NOTE — ED Notes (Signed)
approx 15 min PTA , family /friends noticed right facial droop, slurring speech, left side mouth drooling, right sided headache.

## 2015-05-05 NOTE — ED Provider Notes (Addendum)
[facility]  Emergency Department Provider Note  ____________________________________________  Time seen: Seen upon arrival to the treatment area  I have reviewed the triage vital signs and the nursing notes.   HISTORY  Chief Complaint Code Stroke    HPI Kathy Hernandez is a 82 y.o. female with a history of hypertension as well as the early stages of dementia who is presenting today with a right-sided facial droop and right-sided headache and slurring words and started about 2:55 PM today. It was witnessed by 2 friends. At the time of this interview they say that the patient does not necessarily have a facial droop but is "having 1000 stare." They say that she has not been eating well all week and last week was diagnosed with urinary tract infection here in the emergency department. Although they say she was cleared yesterday with a repeat urinalysis.  At this time the patient says that her headache has resolved. She denies any weakness or numbness.No history of recent falls.   Past Medical History  Diagnosis Date  . Hypertension   . GERD (gastroesophageal reflux disease)   . Memory loss     Patient Active Problem List   Diagnosis Date Noted  . Nausea with vomiting 12/14/2013  . Diverticulitis of colon (without mention of hemorrhage) 12/14/2013    Past Surgical History  Procedure Laterality Date  . Cholecystectomy    . Hernia repair  2012  . Colonoscopy  2010    Current Outpatient Rx  Name  Route  Sig  Dispense  Refill  . aspirin EC 81 MG tablet   Oral   Take 81 mg by mouth daily.         . calcium carbonate (OSCAL) 1500 (600 Ca) MG TABS tablet   Oral   Take 600 mg of elemental calcium by mouth daily.         . cholecalciferol (VITAMIN D) 1000 units tablet   Oral   Take 1,000 Units by mouth daily.         . cyanocobalamin (,VITAMIN B-12,) 1000 MCG/ML injection   Intramuscular   Inject 1,000 mcg into the muscle every 30 (thirty)  days.         Marland Kitchen. esomeprazole (NEXIUM) 40 MG capsule   Oral   Take 40 mg by mouth daily at 12 noon.         Marland Kitchen. lisinopril (PRINIVIL,ZESTRIL) 20 MG tablet   Oral   Take 20 mg by mouth daily.          . memantine (NAMENDA) 5 MG tablet   Oral   Take 5 mg by mouth 2 (two) times daily.         . Multiple Vitamin (MULTIVITAMIN WITH MINERALS) TABS tablet   Oral   Take 1 tablet by mouth daily.         . cephALEXin (KEFLEX) 500 MG capsule   Oral   Take 1 capsule (500 mg total) by mouth 3 (three) times daily. Patient not taking: Reported on 05/05/2015   21 capsule   0     Allergies Review of patient's allergies indicates no known allergies.  No family history on file.  Social History Social History  Substance Use Topics  . Smoking status: Never Smoker   . Smokeless tobacco: Never Used  . Alcohol Use: No    Review of Systems Constitutional: No fever/chills Eyes: No visual changes. ENT: No sore throat. Cardiovascular: Denies chest pain. Respiratory: Denies shortness of breath.  Gastrointestinal: No abdominal pain.  No nausea, no vomiting.  No diarrhea.  No constipation. Genitourinary: Negative for dysuria. Musculoskeletal: Negative for back pain. Skin: Negative for rash. Neurological: Negative for numbness.  10-point ROS otherwise negative.  ____________________________________________   PHYSICAL EXAM:  VITAL SIGNS: ED Triage Vitals  Enc Vitals Group     BP --      Pulse --      Resp --      Temp --      Temp src --      SpO2 --      Weight 05/05/15 1527 185 lb 8 oz (84.142 kg)     Height 05/05/15 1527 5\' 6"  (1.676 m)     Head Cir --      Peak Flow --      Pain Score 05/05/15 1527 4     Pain Loc --      Pain Edu? --      Excl. in GC? --     Constitutional: Alert and oriented. Well appearing and in no acute distress. Eyes: Conjunctivae are normal. PERRL. EOMI. Head: Atraumatic. Nose: No congestion/rhinnorhea. Mouth/Throat: Mucous membranes  are moist.  Oropharynx non-erythematous. Neck: No stridor.   Cardiovascular: Normal rate, regular rhythm. Grossly normal heart sounds.  Good peripheral circulation. Respiratory: Normal respiratory effort.  No retractions. Lungs CTAB. Gastrointestinal: Soft and nontender. No distention. No abdominal bruits. No CVA tenderness. Musculoskeletal: No lower extremity tenderness nor edema.  No joint effusions. Neurologic:  Normal speech and language. No gross focal neurologic deficits are appreciated.  No ataxia on finger to nose testing. Skin:  Skin is warm, dry and intact. No rash noted. Psychiatric: Mood and affect are normal. Speech and behavior are normal.  \NIH Stroke Scale   Interval: Initial exam Time: 325pm Person Administering Scale: Arelia Longest  Administer stroke scale items in the order listed. Record performance in each category after each subscale exam. Do not go back and change scores. Follow directions provided for each exam technique. Scores should reflect what the patient does, not what the clinician thinks the patient can do. The clinician should record answers while administering the exam and work quickly. Except where indicated, the patient should not be coached (i.e., repeated requests to patient to make a special effort).   1a  Level of consciousness: 0=alert; keenly responsive  1b. LOC questions:  0=Performs both tasks correctly  1c. LOC commands: 0=Performs both tasks correctly  2.  Best Gaze: 0=normal  3.  Visual: 0=No visual loss  4. Facial Palsy: 0=Normal symmetric movement  5a.  Motor left arm: 0=No drift, limb holds 90 (or 45) degrees for full 10 seconds  5b.  Motor right arm: 0=No drift, limb holds 90 (or 45) degrees for full 10 seconds  6a. motor left leg: 0=No drift, limb holds 90 (or 45) degrees for full 10 seconds  6b  Motor right leg:  0=No drift, limb holds 90 (or 45) degrees for full 10 seconds  7. Limb Ataxia: 0=Absent  8.  Sensory: 0=Normal; no  sensory loss  9. Best Language:  0=No aphasia, normal  10. Dysarthria: 0=Normal  11. Extinction and Inattention: 0=No abnormality  12. Distal motor function: 0=Normal   Total:   0   ____________________________________________   LABS (all labs ordered are listed, but only abnormal results are displayed)  Labs Reviewed  CBC - Abnormal; Notable for the following:    RDW 15.0 (*)    All other components within normal  limits  COMPREHENSIVE METABOLIC PANEL - Abnormal; Notable for the following:    Potassium 3.3 (*)    Total Protein 6.4 (*)    AST 12 (*)    ALT 11 (*)    GFR calc non Af Amer 55 (*)    All other components within normal limits  GLUCOSE, CAPILLARY - Abnormal; Notable for the following:    Glucose-Capillary 100 (*)    All other components within normal limits  ETHANOL  PROTIME-INR  APTT  DIFFERENTIAL  TROPONIN I  URINE RAPID DRUG SCREEN, HOSP PERFORMED  URINALYSIS COMPLETEWITH MICROSCOPIC ([facility] ONLY)   ____________________________________________  EKG  ED ECG REPORT I, Arelia Longest, the attending physician, personally viewed and interpreted this ECG.   Date: 05/05/2015  EKG Time: 1527  Rate: 73  Rhythm: normal sinus rhythm  Axis: Normal axis  Intervals:none  ST&T Change: No ST segment elevation or depression. No abnormal T-wave inversion.  ____________________________________________  RADIOLOGY  No acute intracranial abnormality on CAT scan of the brain.  MRI without signs of acute injury.     ____________________________________________   PROCEDURES    ____________________________________________    INITIAL IMPRESSION / ASSESSMENT AND PLAN / ED COURSE  Pertinent labs & imaging results that were available during my care of the patient were reviewed by me and considered in my medical decision making (see chart for details).  ----------------------------------------- 3:50 PM on  05/05/2015 -----------------------------------------  I do not find any objective findings on my exam however her 2 friends are insistent that her face looks different than her baseline and she is acting different and this was a sudden onset at 2:55 PM. The specialist on call neurologist has been called for a stat consult. We are pending this consult at this time.  ----------------------------------------- 4:35 PM on 05/05/2015 -----------------------------------------  Patient this time remains without any focal neurologic deficits. Spoke to the specialist on-call neurologist who recommends admission for further workup. We'll give the patient aspirin. Neurologist, Dr.Kalanathi, recommended further workup with MRI/MRA. Does not recommend TPA at this time because believes this event was consistent with TIA. Discussed the plan with the family as well as the patient understands the plan are listed to comply.    ----------------------------------------- 4:50 PM on 05/05/2015 -----------------------------------------  Attempted to send the patient for admission to Dr. Elisabeth Pigeon who says to obtain an MRI and if negative discharge the patient to home. I then discussed this with the neurologist, Dr. Tinnie Gens who said that he would prefer that the patient be admitted but he says that as long as she is kept on a monitor to monitor for A. fib and if the MRI/MRA's are negative then the patient would likely be safe for outpatient echo.  ----------------------------------------- 8:09 PM on 05/05/2015 ----------------------------------------- Patient found to have nitrite positive urinalysis.  Feel that this likely represents e coli infections.  Previous positive culture was for klebsiella.  Has finished course of keflex.  Will dc home with ceftin.  Unclear cause of facial droop.  Still possible tia, but rest of symptoms easily caused by persistent/repeat uti.  Family aware of plan for second course of  antibiotics as well as f/u with pcp for further tia workup including echo.  Has been in sinus rhythm throughout stay.  No focal neuro deficits at this time.  Pt did have brief delirium right after mri but was given ativan and this episode thought to be secondary to this.  Needed ativan secondary to claustrophobia.    Symptoms have now  resolved.   ____________________________________________   FINAL CLINICAL IMPRESSION(S) / ED DIAGNOSES  Possible tia.  Uti.     Myrna Blazer, MD 05/05/15 2013  Pt will continue taking her daily aspirin at home.   Myrna Blazer, MD 05/05/15 2014

## 2015-05-07 LAB — URINE CULTURE

## 2015-05-10 DIAGNOSIS — F0280 Dementia in other diseases classified elsewhere without behavioral disturbance: Secondary | ICD-10-CM | POA: Diagnosis not present

## 2015-05-10 DIAGNOSIS — F0390 Unspecified dementia without behavioral disturbance: Secondary | ICD-10-CM | POA: Diagnosis not present

## 2015-05-10 DIAGNOSIS — Z8673 Personal history of transient ischemic attack (TIA), and cerebral infarction without residual deficits: Secondary | ICD-10-CM | POA: Diagnosis not present

## 2015-05-10 DIAGNOSIS — G300 Alzheimer's disease with early onset: Secondary | ICD-10-CM | POA: Diagnosis not present

## 2015-05-10 DIAGNOSIS — G301 Alzheimer's disease with late onset: Secondary | ICD-10-CM | POA: Diagnosis not present

## 2015-05-11 DIAGNOSIS — Z87440 Personal history of urinary (tract) infections: Secondary | ICD-10-CM | POA: Diagnosis not present

## 2015-05-11 DIAGNOSIS — G459 Transient cerebral ischemic attack, unspecified: Secondary | ICD-10-CM | POA: Diagnosis not present

## 2015-05-23 DIAGNOSIS — I480 Paroxysmal atrial fibrillation: Secondary | ICD-10-CM | POA: Diagnosis not present

## 2015-07-05 DIAGNOSIS — R399 Unspecified symptoms and signs involving the genitourinary system: Secondary | ICD-10-CM | POA: Diagnosis not present

## 2015-07-05 DIAGNOSIS — R110 Nausea: Secondary | ICD-10-CM | POA: Diagnosis not present

## 2015-08-01 DIAGNOSIS — G451 Carotid artery syndrome (hemispheric): Secondary | ICD-10-CM | POA: Diagnosis not present

## 2015-08-01 DIAGNOSIS — E7800 Pure hypercholesterolemia, unspecified: Secondary | ICD-10-CM | POA: Diagnosis not present

## 2015-08-01 DIAGNOSIS — I10 Essential (primary) hypertension: Secondary | ICD-10-CM | POA: Diagnosis not present

## 2015-08-17 DIAGNOSIS — F0280 Dementia in other diseases classified elsewhere without behavioral disturbance: Secondary | ICD-10-CM | POA: Diagnosis not present

## 2015-08-17 DIAGNOSIS — K219 Gastro-esophageal reflux disease without esophagitis: Secondary | ICD-10-CM | POA: Diagnosis not present

## 2015-08-17 DIAGNOSIS — R5381 Other malaise: Secondary | ICD-10-CM | POA: Diagnosis not present

## 2015-08-17 DIAGNOSIS — I10 Essential (primary) hypertension: Secondary | ICD-10-CM | POA: Diagnosis not present

## 2015-08-23 DIAGNOSIS — I10 Essential (primary) hypertension: Secondary | ICD-10-CM | POA: Diagnosis not present

## 2015-08-23 DIAGNOSIS — D649 Anemia, unspecified: Secondary | ICD-10-CM | POA: Diagnosis not present

## 2015-08-31 DIAGNOSIS — I38 Endocarditis, valve unspecified: Secondary | ICD-10-CM | POA: Diagnosis not present

## 2015-08-31 DIAGNOSIS — G451 Carotid artery syndrome (hemispheric): Secondary | ICD-10-CM | POA: Diagnosis not present

## 2015-08-31 DIAGNOSIS — I10 Essential (primary) hypertension: Secondary | ICD-10-CM | POA: Diagnosis not present

## 2015-11-13 DIAGNOSIS — R350 Frequency of micturition: Secondary | ICD-10-CM | POA: Diagnosis not present

## 2015-11-20 DIAGNOSIS — K5792 Diverticulitis of intestine, part unspecified, without perforation or abscess without bleeding: Secondary | ICD-10-CM | POA: Diagnosis not present

## 2015-11-20 DIAGNOSIS — R1031 Right lower quadrant pain: Secondary | ICD-10-CM | POA: Diagnosis not present

## 2015-11-20 DIAGNOSIS — N390 Urinary tract infection, site not specified: Secondary | ICD-10-CM | POA: Diagnosis not present

## 2015-11-20 DIAGNOSIS — R1032 Left lower quadrant pain: Secondary | ICD-10-CM | POA: Diagnosis not present

## 2015-12-04 DIAGNOSIS — R399 Unspecified symptoms and signs involving the genitourinary system: Secondary | ICD-10-CM | POA: Diagnosis not present

## 2015-12-04 DIAGNOSIS — R1030 Lower abdominal pain, unspecified: Secondary | ICD-10-CM | POA: Diagnosis not present

## 2015-12-04 DIAGNOSIS — R102 Pelvic and perineal pain: Secondary | ICD-10-CM | POA: Diagnosis not present

## 2015-12-07 DIAGNOSIS — I10 Essential (primary) hypertension: Secondary | ICD-10-CM | POA: Diagnosis not present

## 2015-12-07 DIAGNOSIS — K219 Gastro-esophageal reflux disease without esophagitis: Secondary | ICD-10-CM | POA: Diagnosis not present

## 2015-12-07 DIAGNOSIS — F0280 Dementia in other diseases classified elsewhere without behavioral disturbance: Secondary | ICD-10-CM | POA: Diagnosis not present

## 2015-12-08 ENCOUNTER — Other Ambulatory Visit: Payer: Self-pay | Admitting: Family Medicine

## 2015-12-08 DIAGNOSIS — R102 Pelvic and perineal pain: Secondary | ICD-10-CM

## 2015-12-08 DIAGNOSIS — R1030 Lower abdominal pain, unspecified: Secondary | ICD-10-CM

## 2015-12-11 ENCOUNTER — Ambulatory Visit: Admission: RE | Admit: 2015-12-11 | Payer: PPO | Source: Ambulatory Visit

## 2015-12-11 ENCOUNTER — Ambulatory Visit
Admission: RE | Admit: 2015-12-11 | Discharge: 2015-12-11 | Disposition: A | Discharge status: Home / Self Care | Payer: PPO | Source: Ambulatory Visit | Attending: Family Medicine | Admitting: Family Medicine

## 2015-12-11 ENCOUNTER — Other Ambulatory Visit
Admission: RE | Admit: 2015-12-11 | Discharge: 2015-12-11 | Disposition: A | Discharge status: Home / Self Care | Payer: PPO | Source: Ambulatory Visit | Attending: Family Medicine | Admitting: Family Medicine

## 2015-12-11 DIAGNOSIS — K449 Diaphragmatic hernia without obstruction or gangrene: Secondary | ICD-10-CM | POA: Diagnosis not present

## 2015-12-11 DIAGNOSIS — K5730 Diverticulosis of large intestine without perforation or abscess without bleeding: Secondary | ICD-10-CM | POA: Insufficient documentation

## 2015-12-11 DIAGNOSIS — R102 Pelvic and perineal pain: Secondary | ICD-10-CM | POA: Diagnosis not present

## 2015-12-11 DIAGNOSIS — R1030 Lower abdominal pain, unspecified: Secondary | ICD-10-CM

## 2015-12-11 DIAGNOSIS — I700 Atherosclerosis of aorta: Secondary | ICD-10-CM | POA: Diagnosis not present

## 2015-12-11 MED ORDER — IOPAMIDOL (ISOVUE-M 200) INJECTION 41%: 10.0000 mL | Freq: Once | INTRAMUSCULAR | Status: DC | PRN

## 2015-12-11 MED ORDER — IOPAMIDOL (ISOVUE-300) INJECTION 61%
100.0000 mL | Freq: Once | INTRAVENOUS | Status: AC | PRN
  Administered 2015-12-11: 100 mL via INTRAVENOUS

## 2015-12-12 LAB — POCT I-STAT CREATININE: CREATININE: 0.7 mg/dL (ref 0.44–1.00)

## 2016-01-04 ENCOUNTER — Other Ambulatory Visit
Admission: RE | Admit: 2016-01-04 | Discharge: 2016-01-04 | Disposition: A | Discharge status: Home / Self Care | Payer: PPO | Source: Skilled Nursing Facility | Attending: Nurse Practitioner | Admitting: Nurse Practitioner

## 2016-01-04 DIAGNOSIS — R8290 Unspecified abnormal findings in urine: Secondary | ICD-10-CM | POA: Insufficient documentation

## 2016-01-04 DIAGNOSIS — F0280 Dementia in other diseases classified elsewhere without behavioral disturbance: Secondary | ICD-10-CM | POA: Diagnosis not present

## 2016-01-04 DIAGNOSIS — R1084 Generalized abdominal pain: Secondary | ICD-10-CM | POA: Diagnosis not present

## 2016-01-04 LAB — URINALYSIS COMPLETE WITH MICROSCOPIC (ARMC ONLY)
Bilirubin Urine: NEGATIVE
GLUCOSE, UA: NEGATIVE mg/dL
KETONES UR: NEGATIVE mg/dL
Nitrite: NEGATIVE
PROTEIN: NEGATIVE mg/dL
Specific Gravity, Urine: <1.005 — ABNORMAL LOW (ref 1.005–1.030)
pH: 6 (ref 5.0–8.0)

## 2016-01-05 DIAGNOSIS — I10 Essential (primary) hypertension: Secondary | ICD-10-CM | POA: Diagnosis not present

## 2016-01-05 DIAGNOSIS — D649 Anemia, unspecified: Secondary | ICD-10-CM | POA: Diagnosis not present

## 2016-01-07 LAB — URINE CULTURE: Culture: >=100000 — AB

## 2016-04-04 DIAGNOSIS — F0280 Dementia in other diseases classified elsewhere without behavioral disturbance: Secondary | ICD-10-CM | POA: Diagnosis not present

## 2016-04-04 DIAGNOSIS — I10 Essential (primary) hypertension: Secondary | ICD-10-CM | POA: Diagnosis not present

## 2016-04-04 DIAGNOSIS — K219 Gastro-esophageal reflux disease without esophagitis: Secondary | ICD-10-CM | POA: Diagnosis not present

## 2016-10-16 ENCOUNTER — Other Ambulatory Visit: Payer: Self-pay | Admitting: Family Medicine

## 2016-10-16 ENCOUNTER — Ambulatory Visit
Admission: RE | Admit: 2016-10-16 | Discharge: 2016-10-16 | Disposition: A | Discharge status: Home / Self Care | Payer: PPO | Source: Ambulatory Visit | Attending: Family Medicine | Admitting: Family Medicine

## 2016-10-16 DIAGNOSIS — Z1231 Encounter for screening mammogram for malignant neoplasm of breast: Secondary | ICD-10-CM | POA: Diagnosis not present

## 2016-10-16 DIAGNOSIS — R413 Other amnesia: Secondary | ICD-10-CM | POA: Diagnosis not present

## 2016-10-16 DIAGNOSIS — I10 Essential (primary) hypertension: Secondary | ICD-10-CM | POA: Diagnosis not present

## 2016-10-16 DIAGNOSIS — M79662 Pain in left lower leg: Secondary | ICD-10-CM | POA: Diagnosis not present

## 2016-10-16 DIAGNOSIS — K219 Gastro-esophageal reflux disease without esophagitis: Secondary | ICD-10-CM | POA: Diagnosis not present

## 2016-10-16 DIAGNOSIS — M25475 Effusion, left foot: Secondary | ICD-10-CM | POA: Diagnosis not present

## 2016-10-16 DIAGNOSIS — E2839 Other primary ovarian failure: Secondary | ICD-10-CM

## 2016-10-16 DIAGNOSIS — M7989 Other specified soft tissue disorders: Principal | ICD-10-CM

## 2016-10-16 DIAGNOSIS — G300 Alzheimer's disease with early onset: Secondary | ICD-10-CM | POA: Diagnosis not present

## 2016-10-16 DIAGNOSIS — R600 Localized edema: Secondary | ICD-10-CM | POA: Diagnosis not present

## 2016-10-16 DIAGNOSIS — Z23 Encounter for immunization: Secondary | ICD-10-CM | POA: Diagnosis not present

## 2016-10-16 DIAGNOSIS — F0280 Dementia in other diseases classified elsewhere without behavioral disturbance: Secondary | ICD-10-CM | POA: Diagnosis not present

## 2016-10-21 DIAGNOSIS — H2513 Age-related nuclear cataract, bilateral: Secondary | ICD-10-CM | POA: Diagnosis not present

## 2016-11-18 DIAGNOSIS — H35363 Drusen (degenerative) of macula, bilateral: Secondary | ICD-10-CM | POA: Diagnosis not present

## 2016-11-18 DIAGNOSIS — H35033 Hypertensive retinopathy, bilateral: Secondary | ICD-10-CM | POA: Diagnosis not present

## 2016-11-18 DIAGNOSIS — H2512 Age-related nuclear cataract, left eye: Secondary | ICD-10-CM | POA: Diagnosis not present

## 2016-11-18 DIAGNOSIS — H2513 Age-related nuclear cataract, bilateral: Secondary | ICD-10-CM | POA: Diagnosis not present

## 2016-11-18 DIAGNOSIS — H25012 Cortical age-related cataract, left eye: Secondary | ICD-10-CM | POA: Diagnosis not present

## 2016-11-18 DIAGNOSIS — H25013 Cortical age-related cataract, bilateral: Secondary | ICD-10-CM | POA: Diagnosis not present

## 2016-12-03 DIAGNOSIS — H25812 Combined forms of age-related cataract, left eye: Secondary | ICD-10-CM | POA: Diagnosis not present

## 2016-12-03 DIAGNOSIS — H2512 Age-related nuclear cataract, left eye: Secondary | ICD-10-CM | POA: Diagnosis not present

## 2016-12-11 DIAGNOSIS — H2512 Age-related nuclear cataract, left eye: Secondary | ICD-10-CM | POA: Diagnosis not present

## 2017-01-01 DIAGNOSIS — H2511 Age-related nuclear cataract, right eye: Secondary | ICD-10-CM | POA: Diagnosis not present

## 2017-01-21 DIAGNOSIS — H2511 Age-related nuclear cataract, right eye: Secondary | ICD-10-CM | POA: Diagnosis not present

## 2017-01-21 DIAGNOSIS — H25811 Combined forms of age-related cataract, right eye: Secondary | ICD-10-CM | POA: Diagnosis not present

## 2017-01-29 DIAGNOSIS — H2511 Age-related nuclear cataract, right eye: Secondary | ICD-10-CM | POA: Diagnosis not present

## 2017-02-15 IMAGING — MR MR MRA HEAD W/O CM
1 series · 21 of 48 positions shown · IV contrast (agent unspecified)
Comparison: CT head earlier today.

CLINICAL DATA: Drooling. RIGHT-sided facial droop and slurred
speech.

EXAM:
MR HEAD WITHOUT CONTRAST
MR CIRCLE OF WILLIS WITHOUT CONTRAST
MRA OF THE NECK WITHOUT  CONTRAST
TECHNIQUE: Multiplanar, multiecho pulse sequences of the brain and surrounding
structures were obtained according to standard protocol without
intravenous contrast. Angiographic images of the neck were obtained
using MRA technique without intravenous contrast.; Angiographic
images of the Circle of Willis were obtained using MRA technique
without intravenous contrast.
CONTRAST:  None.

[Series 2: TOF · axial · non-contrast · 0.7mm · 0.37mm/px · z∈[-40,+59]mm · 21 of 150 slices shown]
[im 1/150]
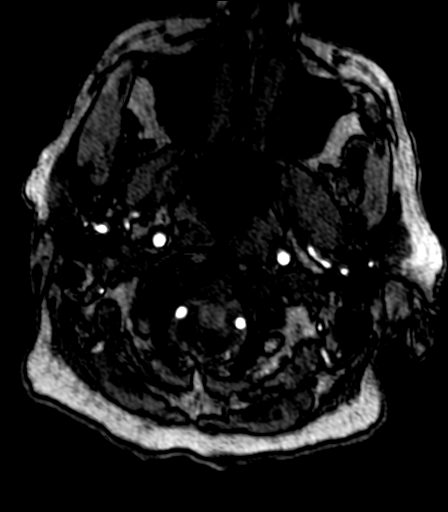
[im 4/150]
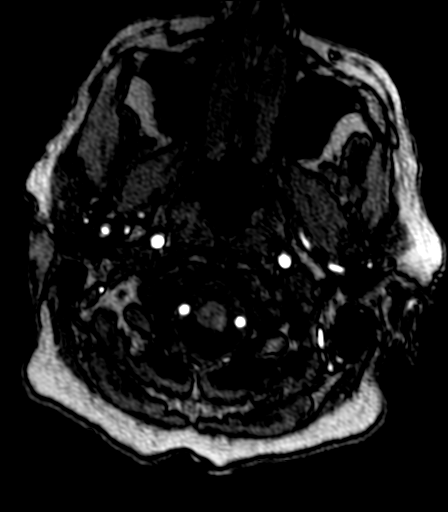
[im 7/150]
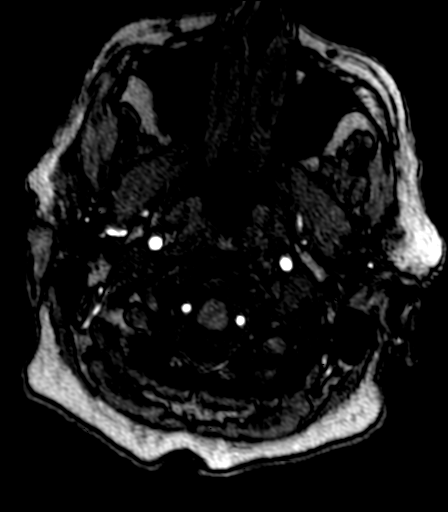
[im 10/150]
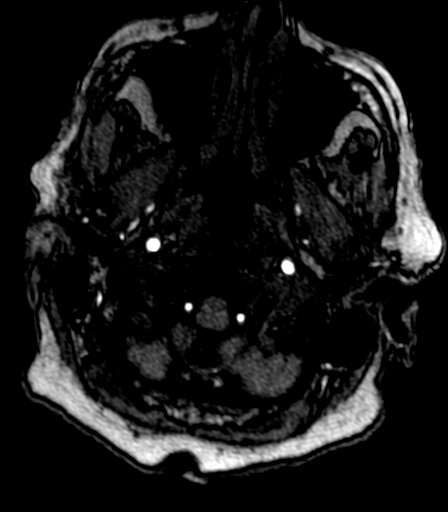
[im 13/150]
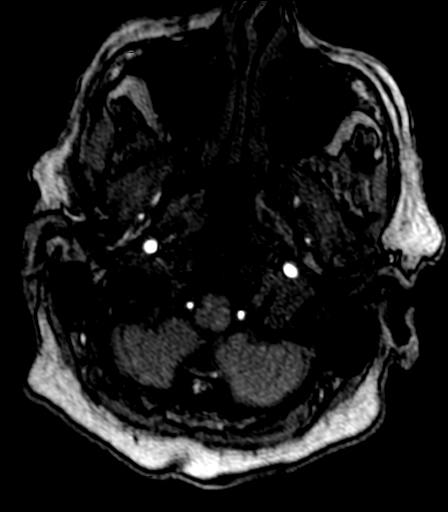
[im 16/150]
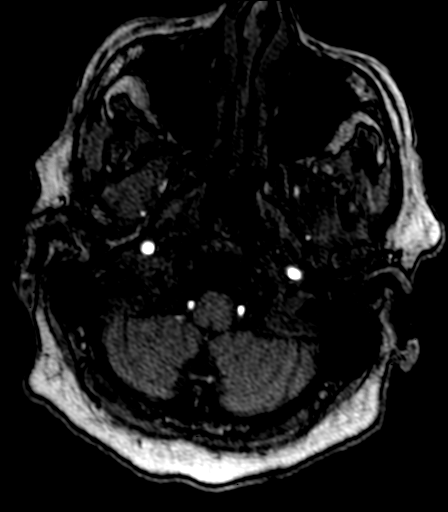
[im 20/150]
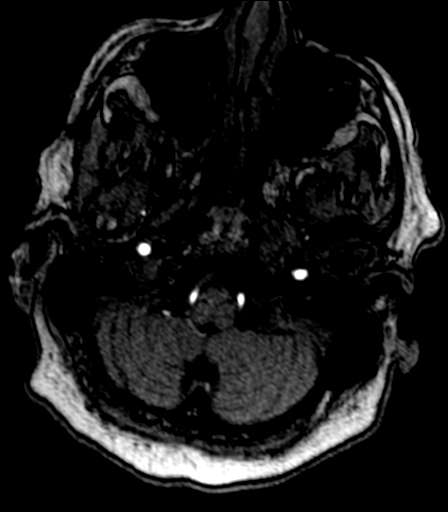
[im 23/150]
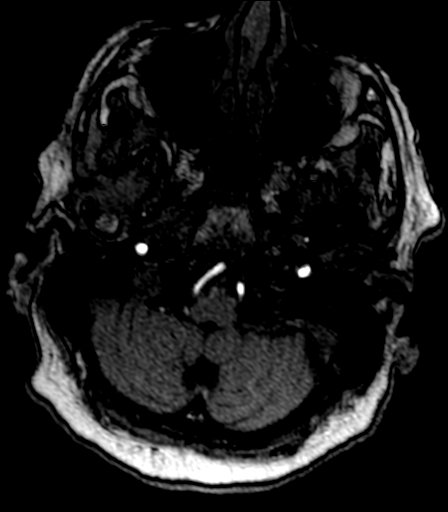
[im 26/150]
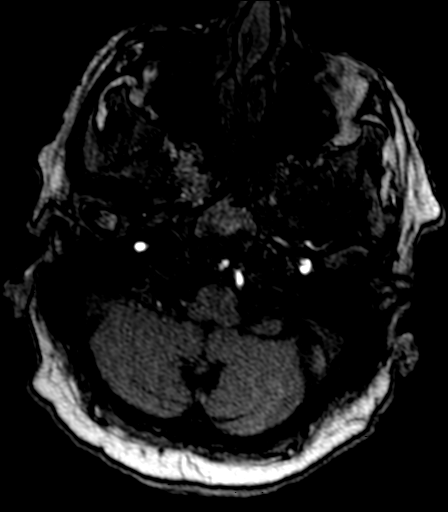
[im 29/150]
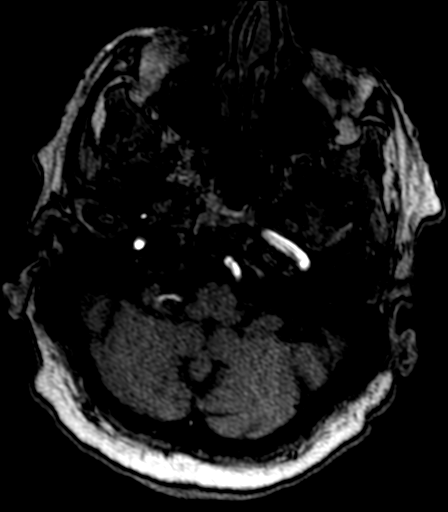
[im 32/150]
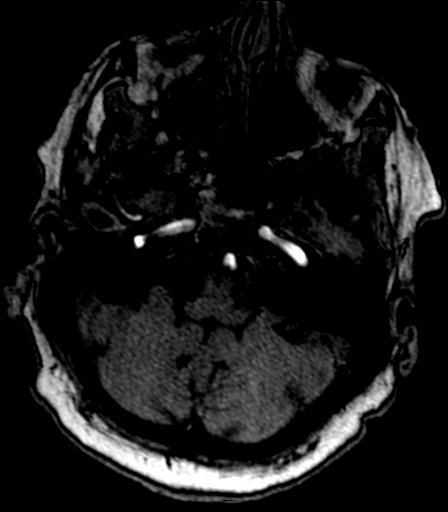
[im 35/150]
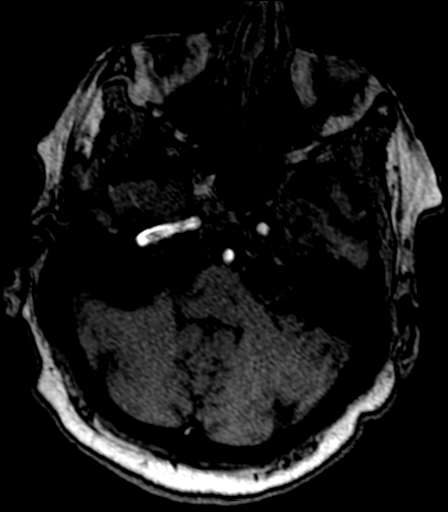
[im 39/150]
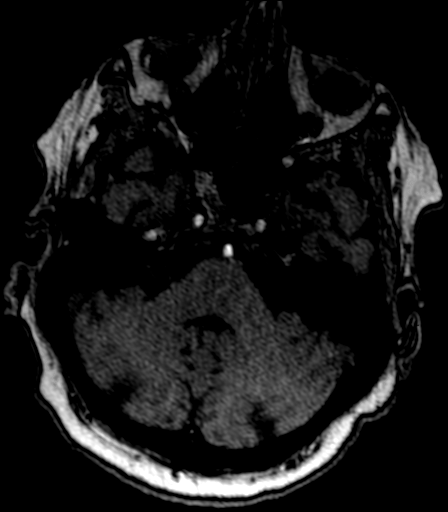
[im 48/150]
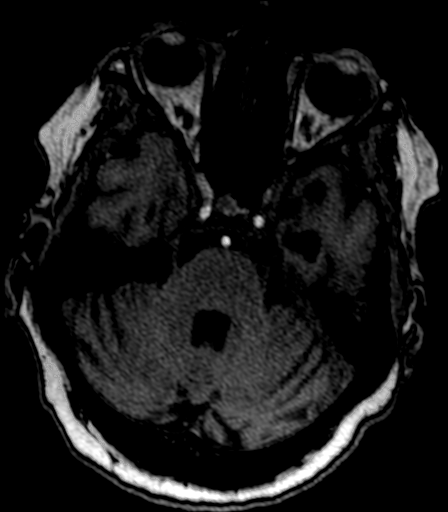
[im 67/150]
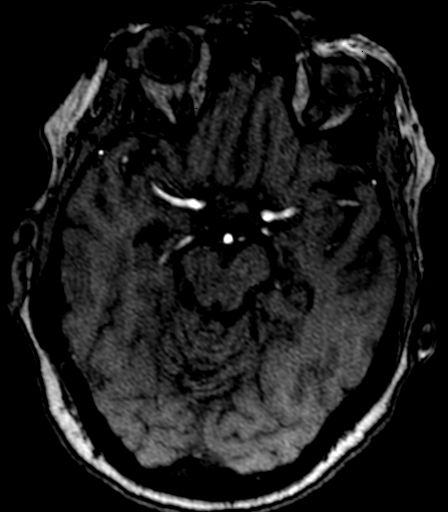
[im 77/150]
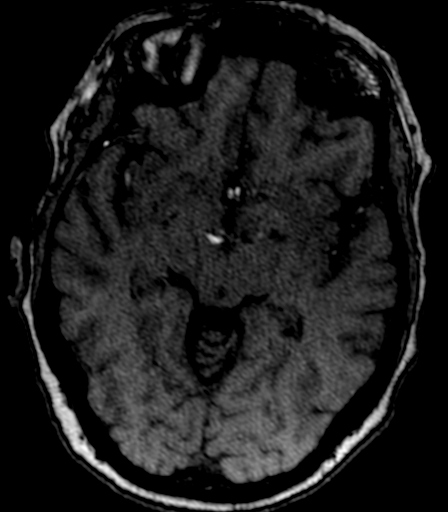
[im 86/150]
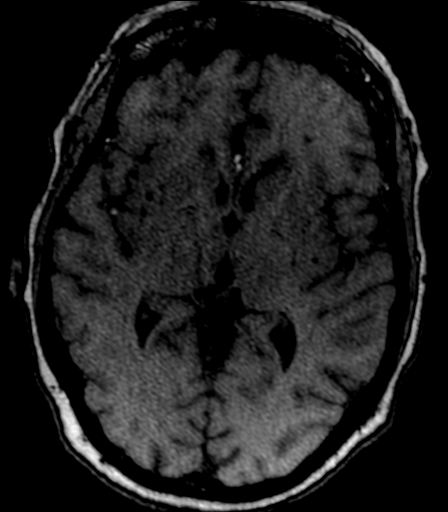
[im 105/150]
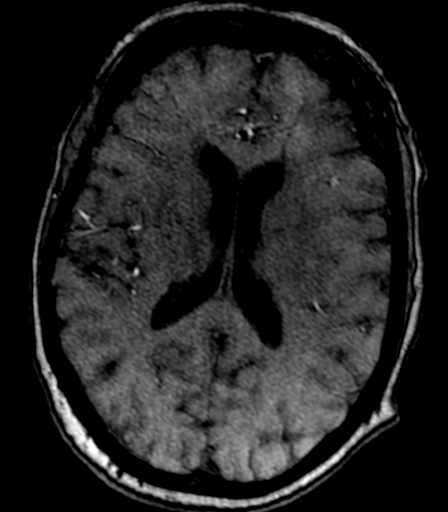
[im 124/150]
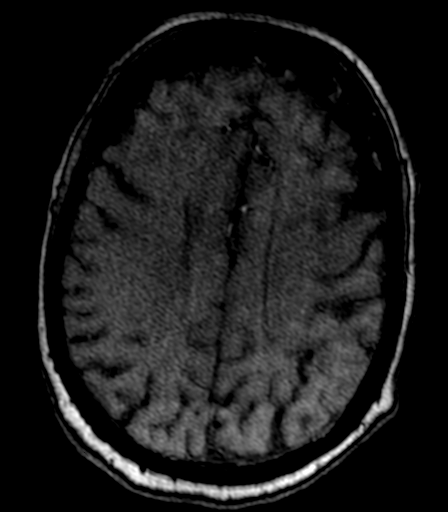
[im 127/150]
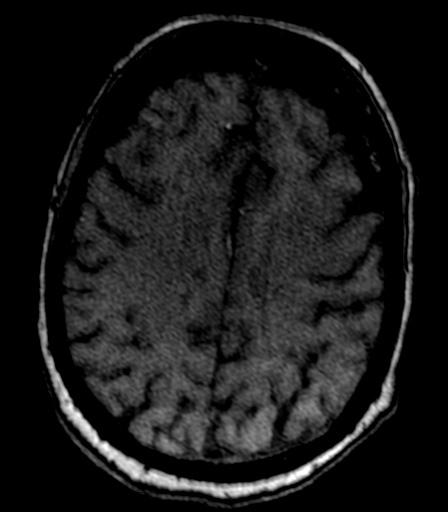
[im 143/150]
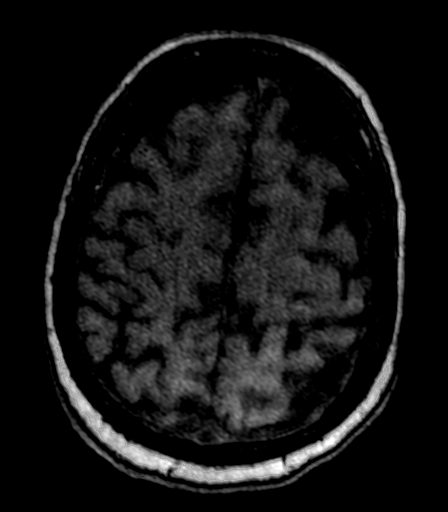

[21 of 48 positions shown; findings below may reference images not displayed]

FINDINGS: MR HEAD FINDINGS

No evidence for acute infarction, hemorrhage, mass lesion,
hydrocephalus, or extra-axial fluid. Advanced cerebral and
cerebellar atrophy. Moderately extensive white matter signal
abnormality, likely chronic microvascular ischemic change.

Flow voids are maintained throughout the carotid, basilar, and
vertebral arteries. There are no areas of chronic hemorrhage.

Partial empty sella. No tonsillar herniation. Cervical spondylosis.

Extracranial soft tissues unremarkable.

MR CIRCLE OF WILLIS FINDINGS

The internal carotid arteries are widely patent. The basilar artery
is widely patent with vertebrals codominant.

There is mild non stenotic irregularity of both MCA M1 segments.
Poor visualization of the M2 and M3 MCA branches, likely due to a
combination of poor cardiac output and intracranial atherosclerotic
change.

Mild irregularity of both A1 ACA segments.  These are codominant.

The P1 and P2 segments of the posterior cerebral arteries are widely
patent, but beyond that there is poor visualization. BILATERAL
superior cerebellar arteries and RIGHT PICA are visualized and
patent, but no other cerebellar vessels are seen.

No intracranial aneurysm is evident.

MRA NECK FINDINGS

Examination is limited by the noncontrast technique. Great vessel
origins are incompletely evaluated due to overlying soft tissue.

Both carotid bifurcations are seen and are patent. No stenosis or
dissection is evident.

Both vertebral arteries are patent. There is a segmental narrowing
in the vertebral artery on the LEFT midportion which is possibly
artifactual. Similar irregularity at slightly higher and lower
segmental areas on the RIGHT, an unusual location for vertebral
artery disease. Both contribute equally to the basilar.
IMPRESSION: Atrophy and small vessel disease.  No acute intracranial findings.

No proximal flow reducing lesion on MRA intracranial. Patency of the
BILATERAL ICA, MCA, basilar, and vertebral arteries is established.

Poor quality extracranial MRA performed with noncontrast technique.
Both carotid bifurcations are seen and are patent. Both vertebral
arteries are patent and contribute equally to the basilar but there
may be artifactual signal loss in the mid portions. See discussion
above.

## 2017-02-15 IMAGING — CT CT HEAD W/O CM
1 series · 16 of 30 positions shown, 20 images · non-contrast
Comparison: CT scan of April 26, 2015.

CLINICAL DATA: Left-sided facial droop, right-sided headache.

EXAM:
CT HEAD WITHOUT CONTRAST
TECHNIQUE: Contiguous axial images were obtained from the base of the skull
through the vertex without intravenous contrast.

[Series 2: head wo · axial · 0.42mm/px · z∈[+162,+288]mm · 16 of 32 slices shown, 20 images]
[im 2/32  brain]
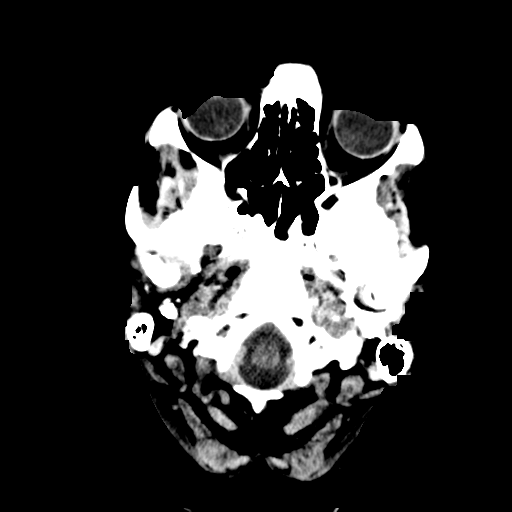
[im 2/32  bone]
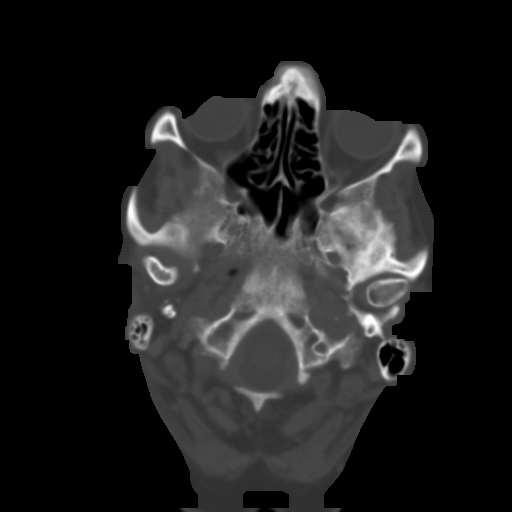
[im 4/32  brain]
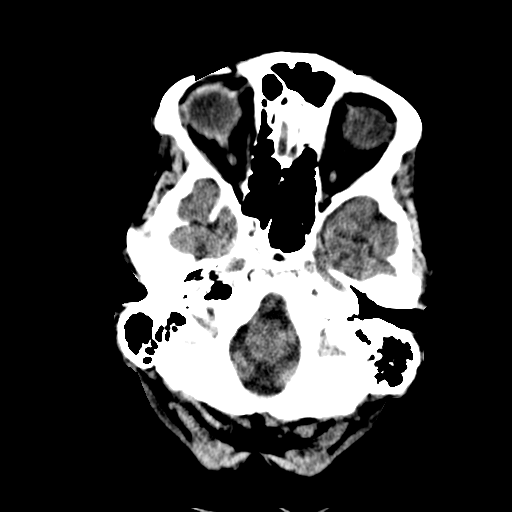
[im 6/32  brain]
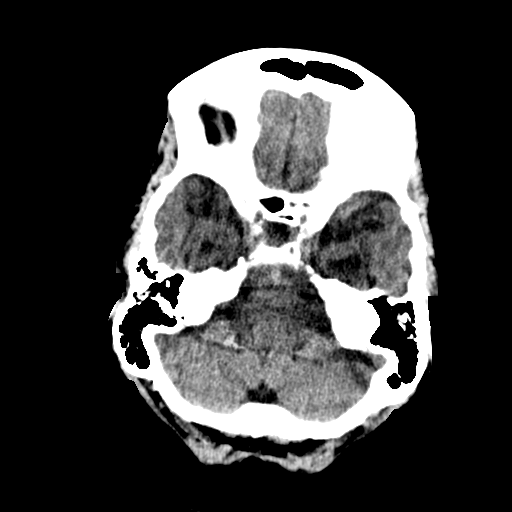
[im 8/32  brain]
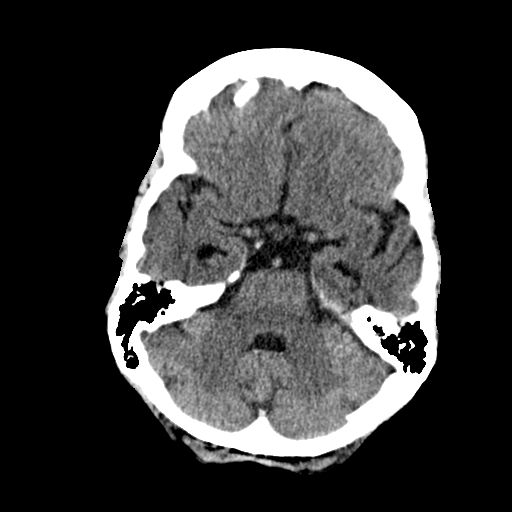
[im 9/32  brain]
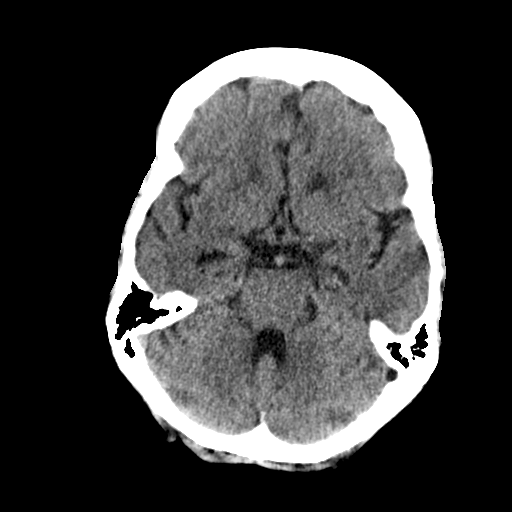
[im 9/32  bone]
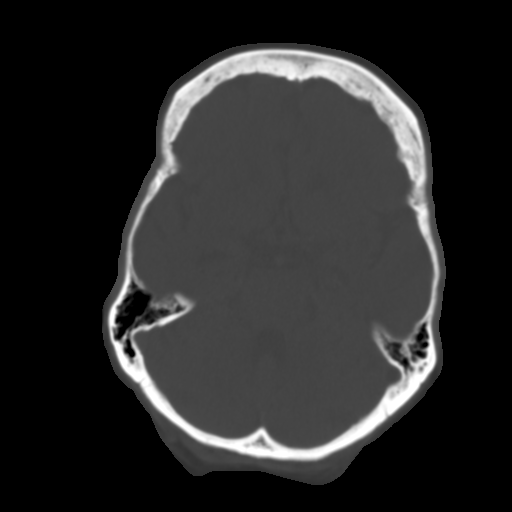
[im 11/32  brain]
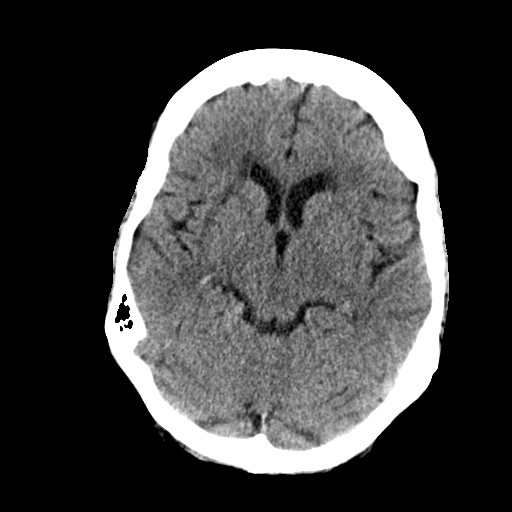
[im 13/32  brain]
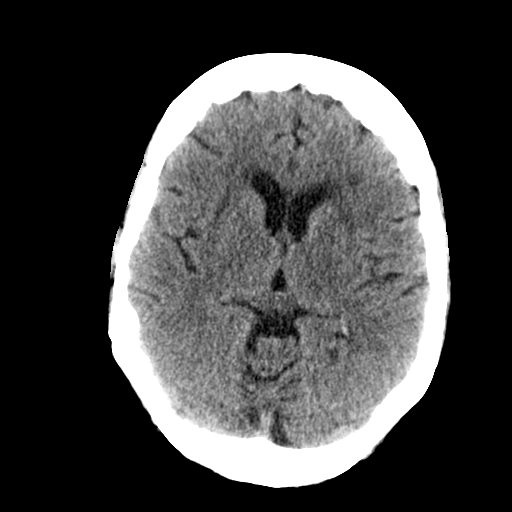
[im 15/32  brain]
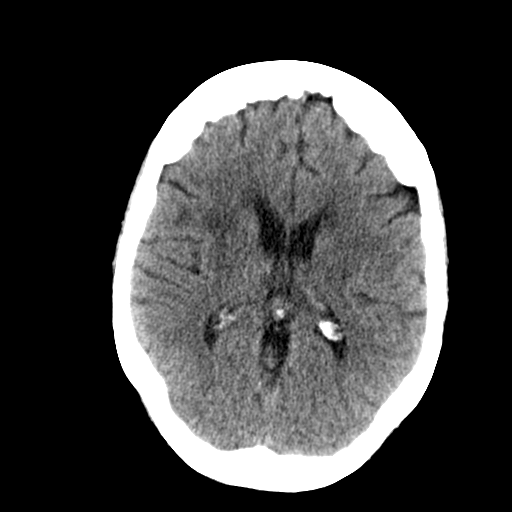
[im 17/32  brain]
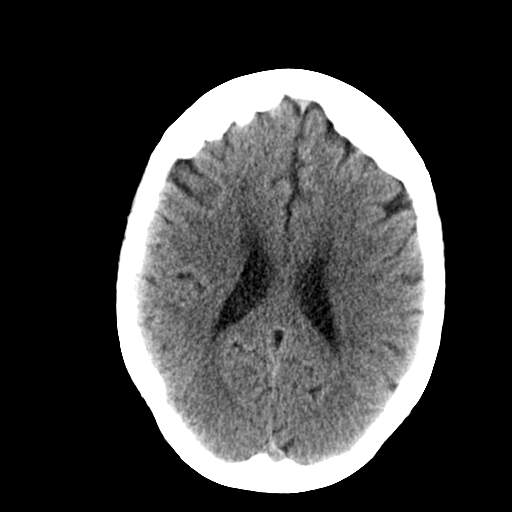
[im 17/32  bone]
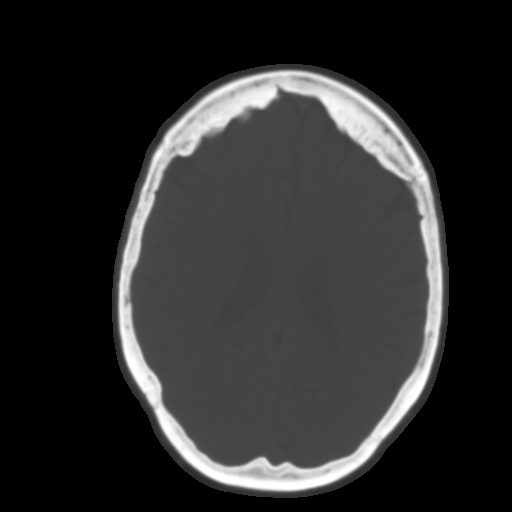
[im 19/32  brain]
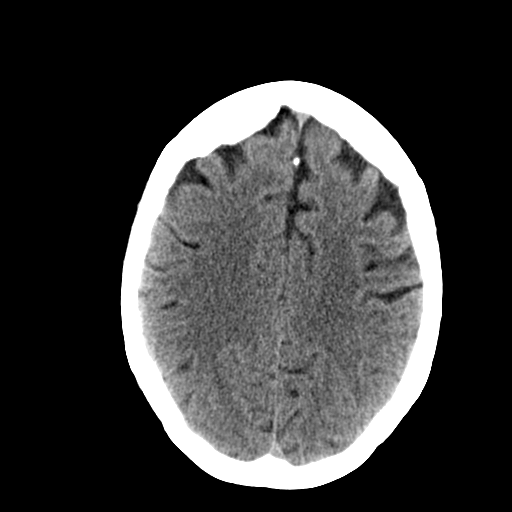
[im 21/32  brain]
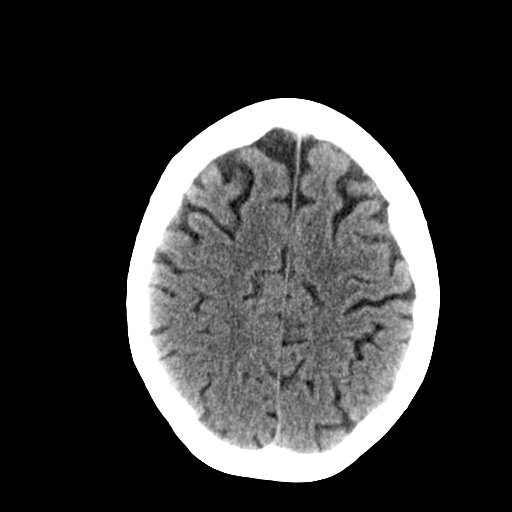
[im 23/32  brain]
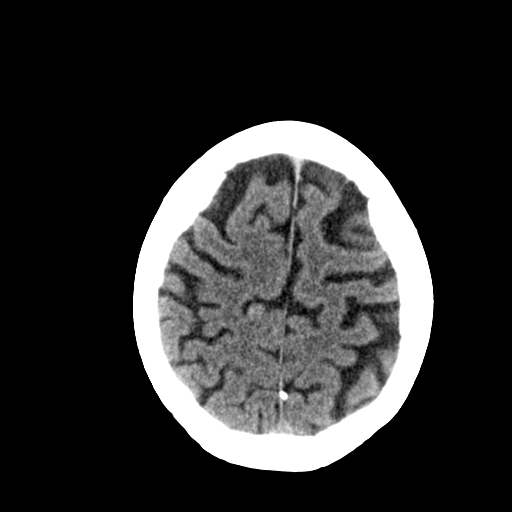
[im 24/32  brain]
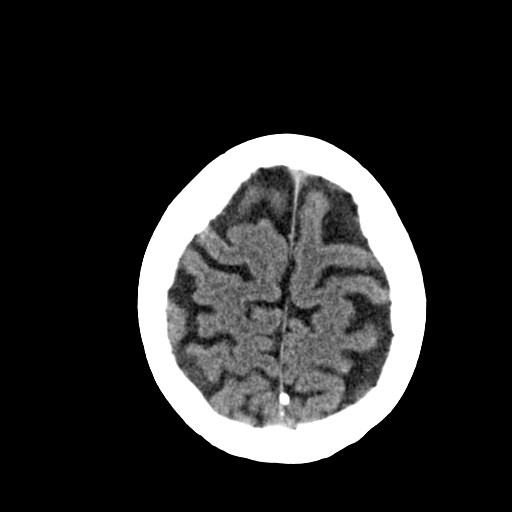
[im 24/32  bone]
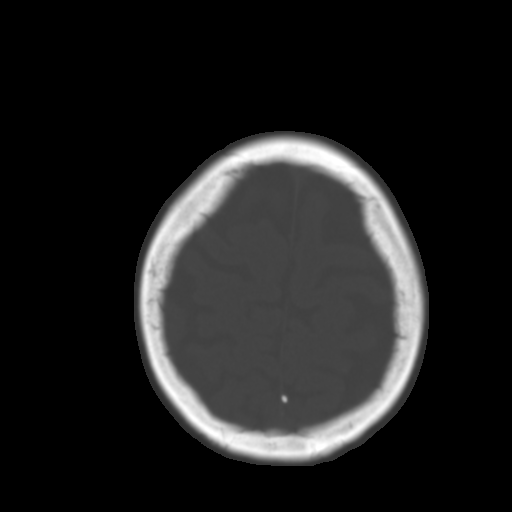
[im 26/32  brain]
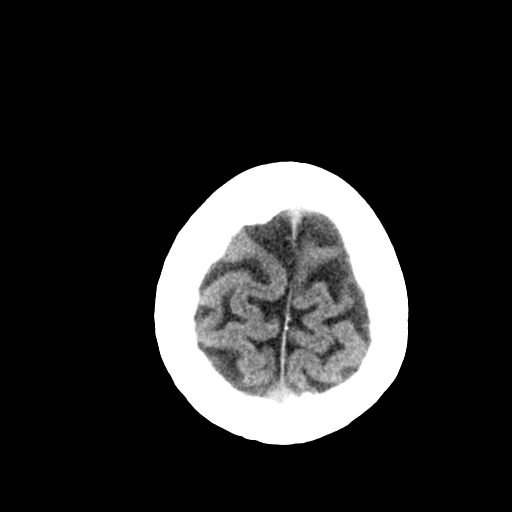
[im 28/32  brain]
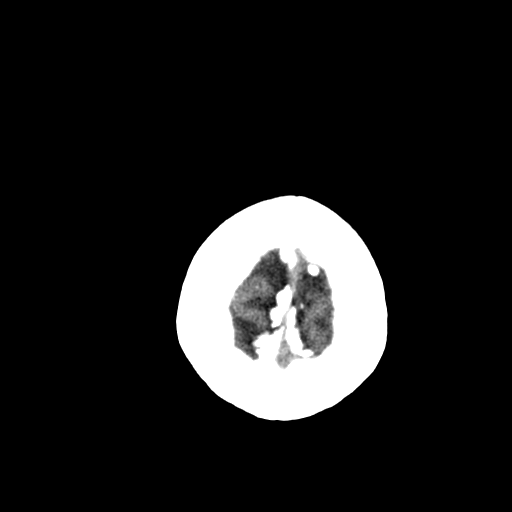
[im 30/32  brain]
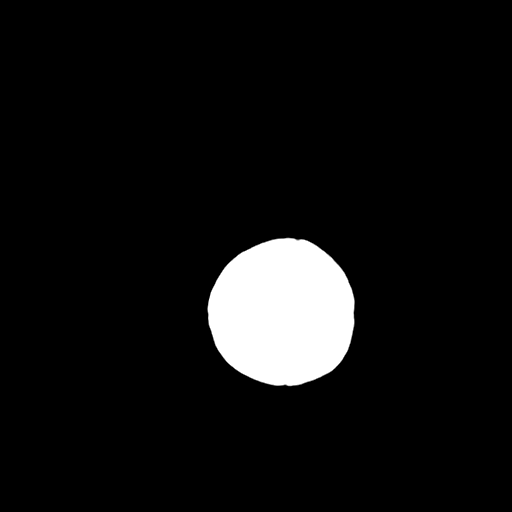

[16 of 30 positions shown; findings below may reference images not displayed]

FINDINGS: Bony calvarium appears intact. Mild chronic ischemic white matter
disease is noted. No mass effect or midline shift is noted.
Ventricular size is within normal limits. There is no evidence of
mass lesion, hemorrhage or acute infarction.
IMPRESSION: Mild chronic ischemic white matter disease. No acute intracranial
abnormality seen. These results were called by telephone at the time
of interpretation on 05/05/2015 at [DATE] to Dr. Fedilus Sully, who
verbally acknowledged these results.

## 2017-05-07 ENCOUNTER — Ambulatory Visit (INDEPENDENT_AMBULATORY_CARE_PROVIDER_SITE_OTHER): Payer: PPO

## 2017-05-07 ENCOUNTER — Ambulatory Visit
Admission: EM | Admit: 2017-05-07 | Discharge: 2017-05-07 | Disposition: A | Discharge status: Home / Self Care | Payer: PPO | Attending: Family Medicine | Admitting: Family Medicine

## 2017-05-07 ENCOUNTER — Other Ambulatory Visit: Payer: Self-pay

## 2017-05-07 ENCOUNTER — Encounter: Payer: Self-pay | Admitting: *Deleted

## 2017-05-07 DIAGNOSIS — M545 Low back pain: Secondary | ICD-10-CM | POA: Diagnosis not present

## 2017-05-07 DIAGNOSIS — M5136 Other intervertebral disc degeneration, lumbar region: Secondary | ICD-10-CM

## 2017-05-07 MED ORDER — ACETAMINOPHEN 500 MG PO TABS
1000.0000 mg | ORAL_TABLET | Freq: Once | ORAL | Status: AC
  Administered 2017-05-07: 1000 mg via ORAL

## 2017-05-07 NOTE — ED Triage Notes (Signed)
Patient started having unexplained lower back pain 4 days ago that became severe last PM. No previous history of back injury or surgery.

## 2017-05-07 NOTE — Discharge Instructions (Signed)
Follow up with Primary Care Provider °

## 2017-05-07 NOTE — ED Provider Notes (Signed)
[facility]    CSN: [id number] Arrival date & time: 05/07/17  0835     History   Chief Complaint Chief Complaint  Patient presents with  . Back Pain    HPI Glenyce Randle Uemura is a 84 y.o. female.   84 yo female with a c/o low back pain for 4 days which feels worse today. Patient points to the lumbar sacral spine area. Denies any falls, injuries, direct trauma, fevers, chills, dysuria, diarrhea, constipation, numbness/tingling, radiating pain.    The history is provided by the patient.    Past Medical History:  Diagnosis Date  . GERD (gastroesophageal reflux disease)   . Hypertension   . Memory loss     Patient Active Problem List   Diagnosis Date Noted  . Nausea with vomiting 12/14/2013  . Diverticulitis of colon (without mention of hemorrhage)(562.11) 12/14/2013    Past Surgical History:  Procedure Laterality Date  . CHOLECYSTECTOMY    . COLONOSCOPY  2010  . HERNIA REPAIR  2012    OB History    No data available       Home Medications    Prior to Admission medications   Medication Sig Start Date End Date Taking? Authorizing Provider  aspirin EC 81 MG tablet Take 81 mg by mouth daily.   Yes [provider]  calcium carbonate (OSCAL) 1500 (600 Ca) MG TABS tablet Take 600 mg of elemental calcium by mouth daily.   Yes [provider]  esomeprazole (NEXIUM) 40 MG capsule Take 40 mg by mouth daily at 12 noon.   Yes [provider]  lisinopril (PRINIVIL,ZESTRIL) 20 MG tablet Take 20 mg by mouth daily.  10/14/13  Yes [provider]  cephALEXin (KEFLEX) 500 MG capsule Take 1 capsule (500 mg total) by mouth 3 (three) times daily. Patient not taking: Reported on 05/05/2015 04/26/15   Sharman Cheek, MD  cholecalciferol (VITAMIN D) 1000 units tablet Take 1,000 Units by mouth daily.    [provider]  cyanocobalamin (,VITAMIN B-12,) 1000 MCG/ML injection Inject 1,000 mcg into the muscle every 30 (thirty) days.     [provider]  memantine (NAMENDA) 5 MG tablet Take 5 mg by mouth 2 (two) times daily.    [provider]  Multiple Vitamin (MULTIVITAMIN WITH MINERALS) TABS tablet Take 1 tablet by mouth daily.    [provider]    Family History Family History  Problem Relation Age of Onset  . Tuberculosis Mother     Social History Social History   Tobacco Use  . Smoking status: Never Smoker  . Smokeless tobacco: Never Used  Substance Use Topics  . Alcohol use: No  . Drug use: No     Allergies   Patient has no known allergies.   Review of Systems Review of Systems   Physical Exam Triage Vital Signs ED Triage Vitals  Enc Vitals Group     BP 05/07/17 0850 (!) 150/90     Pulse Rate 05/07/17 0850 71     Resp 05/07/17 0850 16     Temp 05/07/17 0850 98 F (36.7 C)     Temp Source 05/07/17 0850 Oral     SpO2 05/07/17 0850 97 %     Weight 05/07/17 0854 225 lb (102.1 kg)     Height 05/07/17 0854 5\' 6"  (1.676 m)     Head Circumference --      Peak Flow --      Pain Score 05/07/17 0854 9  Pain Loc --      Pain Edu? --      Excl. in GC? --    No data found.  Updated Vital Signs BP (!) 150/90 (BP Location: Left Arm)   Pulse 71   Temp 98 F (36.7 C) (Oral)   Resp 16   Ht 5\' 6"  (1.676 m)   Wt 225 lb (102.1 kg)   SpO2 97%   BMI 36.32 kg/m   Visual Acuity Right Eye Distance:   Left Eye Distance:   Bilateral Distance:    Right Eye Near:   Left Eye Near:    Bilateral Near:     Physical Exam  Constitutional: She appears well-developed and well-nourished. No distress.  Musculoskeletal: She exhibits tenderness. She exhibits no edema.       Lumbar back: She exhibits bony tenderness. She exhibits normal range of motion, no tenderness, no swelling, no edema, no deformity, no laceration, no pain, no spasm and normal pulse.  Neurological: She is alert. She has normal reflexes. She displays normal reflexes. She exhibits normal muscle tone.  Skin:  Skin is warm and dry. No rash noted. She is not diaphoretic. No erythema.  Nursing note and vitals reviewed.    UC Treatments / Results  Labs (all labs ordered are listed, but only abnormal results are displayed) Labs Reviewed - No data to display  EKG  EKG Interpretation None       Radiology Dg Lumbar Spine Complete  Result Date: 05/07/2017 CLINICAL DATA:  Low back pain EXAM: LUMBAR SPINE - COMPLETE 4+ VIEW COMPARISON:  CT abdomen pelvis 12/11/2015 FINDINGS: Mild anterolisthesis L3-4. Mild retrolisthesis L5-S1. This is unchanged. Negative for fracture or mass. Mild disc degeneration and disc space narrowing L2-3, L3-4, L4-5. Moderate disc degeneration at L5-S1 with vacuum disc phenomenon and spondylosis. Negative for pars defect. Mild levoscoliosis IMPRESSION: Scoliosis and degenerative change.  No acute abnormality. Electronically Signed   By: Marlan Palauharles  Clark M.D.   On: 05/07/2017 09:47    Procedures Procedures (including critical care time)  Medications Ordered in UC Medications  acetaminophen (TYLENOL) tablet 1,000 mg (1,000 mg Oral Given 05/07/17 0927)     Initial Impression / Assessment and Plan / UC Course  I have reviewed the triage vital signs and the nursing notes.  Pertinent labs & imaging results that were available during my care of the patient were reviewed by me and considered in my medical decision making (see chart for details).       Final Clinical Impressions(s) / UC Diagnoses   Final diagnoses:  Degenerative disc disease, lumbar    ED Discharge Orders    None     1. X-ray results and diagnosis reviewed with patient and son 2. Recommend supportive treatment with otc acetaminophen prn pain; use of walker for mobility/stability aid   3. Follow-up with PCP for further evaluation and/or possible referral to spine orthopedist    Controlled Substance Prescriptions [place] Controlled Substance Registry consulted? Not Applicable   Payton Mccallumonty, Zamiah Tollett,  MD 05/07/17 1345

## 2017-05-10 ENCOUNTER — Telehealth: Payer: Self-pay

## 2017-05-10 NOTE — Telephone Encounter (Signed)
Called to follow up with patient [facility]. Spoke with pt. Pt. Reports no improvement. Encouraged follow up for re-evaluation. Patient instructed to call back with any questions or concerns. [facility]

## 2017-08-18 DIAGNOSIS — H26493 Other secondary cataract, bilateral: Secondary | ICD-10-CM | POA: Diagnosis not present

## 2017-08-20 DIAGNOSIS — Z8719 Personal history of other diseases of the digestive system: Secondary | ICD-10-CM | POA: Diagnosis not present

## 2017-08-20 DIAGNOSIS — G300 Alzheimer's disease with early onset: Secondary | ICD-10-CM | POA: Diagnosis not present

## 2017-08-20 DIAGNOSIS — Z1231 Encounter for screening mammogram for malignant neoplasm of breast: Secondary | ICD-10-CM | POA: Diagnosis not present

## 2017-08-20 DIAGNOSIS — F0280 Dementia in other diseases classified elsewhere without behavioral disturbance: Secondary | ICD-10-CM | POA: Diagnosis not present

## 2017-08-20 DIAGNOSIS — I10 Essential (primary) hypertension: Secondary | ICD-10-CM | POA: Diagnosis not present

## 2017-08-20 DIAGNOSIS — E2839 Other primary ovarian failure: Secondary | ICD-10-CM | POA: Diagnosis not present

## 2017-08-20 DIAGNOSIS — Z8349 Family history of other endocrine, nutritional and metabolic diseases: Secondary | ICD-10-CM | POA: Diagnosis not present

## 2017-08-20 DIAGNOSIS — K219 Gastro-esophageal reflux disease without esophagitis: Secondary | ICD-10-CM | POA: Diagnosis not present

## 2017-08-25 ENCOUNTER — Other Ambulatory Visit: Payer: Self-pay | Admitting: Family Medicine

## 2017-08-25 DIAGNOSIS — E2839 Other primary ovarian failure: Secondary | ICD-10-CM

## 2017-10-20 DIAGNOSIS — R350 Frequency of micturition: Secondary | ICD-10-CM | POA: Diagnosis not present

## 2017-10-20 DIAGNOSIS — G4700 Insomnia, unspecified: Secondary | ICD-10-CM | POA: Diagnosis not present

## 2017-10-20 DIAGNOSIS — L299 Pruritus, unspecified: Secondary | ICD-10-CM | POA: Diagnosis not present

## 2017-10-20 DIAGNOSIS — K219 Gastro-esophageal reflux disease without esophagitis: Secondary | ICD-10-CM | POA: Diagnosis not present

## 2018-02-24 DIAGNOSIS — N390 Urinary tract infection, site not specified: Secondary | ICD-10-CM | POA: Diagnosis not present

## 2018-02-24 DIAGNOSIS — Z79899 Other long term (current) drug therapy: Secondary | ICD-10-CM | POA: Diagnosis not present

## 2018-02-25 ENCOUNTER — Emergency Department: Payer: PPO

## 2018-02-25 ENCOUNTER — Encounter: Payer: Self-pay | Admitting: Emergency Medicine

## 2018-02-25 ENCOUNTER — Other Ambulatory Visit: Payer: Self-pay

## 2018-02-25 ENCOUNTER — Emergency Department
Admission: EM | Admit: 2018-02-25 | Discharge: 2018-02-25 | Disposition: A | Discharge status: Home / Self Care | Payer: PPO | Attending: Emergency Medicine | Admitting: Emergency Medicine

## 2018-02-25 DIAGNOSIS — Z79899 Other long term (current) drug therapy: Secondary | ICD-10-CM | POA: Diagnosis not present

## 2018-02-25 DIAGNOSIS — G309 Alzheimer's disease, unspecified: Secondary | ICD-10-CM | POA: Diagnosis not present

## 2018-02-25 DIAGNOSIS — I10 Essential (primary) hypertension: Secondary | ICD-10-CM | POA: Insufficient documentation

## 2018-02-25 DIAGNOSIS — R112 Nausea with vomiting, unspecified: Secondary | ICD-10-CM | POA: Insufficient documentation

## 2018-02-25 DIAGNOSIS — Z7982 Long term (current) use of aspirin: Secondary | ICD-10-CM | POA: Diagnosis not present

## 2018-02-25 DIAGNOSIS — R410 Disorientation, unspecified: Secondary | ICD-10-CM | POA: Insufficient documentation

## 2018-02-25 DIAGNOSIS — N3001 Acute cystitis with hematuria: Secondary | ICD-10-CM

## 2018-02-25 DIAGNOSIS — E860 Dehydration: Secondary | ICD-10-CM | POA: Diagnosis not present

## 2018-02-25 LAB — URINALYSIS, COMPLETE (UACMP) WITH MICROSCOPIC
BILIRUBIN URINE: NEGATIVE
Glucose, UA: NEGATIVE mg/dL
Ketones, ur: 20 mg/dL — AB
LEUKOCYTES UA: NEGATIVE
NITRITE: NEGATIVE
PH: 7 (ref 5.0–8.0)
Protein, ur: NEGATIVE mg/dL
SPECIFIC GRAVITY, URINE: 1.016 (ref 1.005–1.030)

## 2018-02-25 LAB — COMPREHENSIVE METABOLIC PANEL
ALT: 20 U/L (ref 0–44)
AST: 26 U/L (ref 15–41)
Albumin: 4 g/dL (ref 3.5–5.0)
Alkaline Phosphatase: 64 U/L (ref 38–126)
Anion gap: 10 (ref 5–15)
BUN: 13 mg/dL (ref 8–23)
CHLORIDE: 102 mmol/L (ref 98–111)
CO2: 27 mmol/L (ref 22–32)
Calcium: 8.7 mg/dL — ABNORMAL LOW (ref 8.9–10.3)
Creatinine, Ser: 0.82 mg/dL (ref 0.44–1.00)
Glucose, Bld: 154 mg/dL — ABNORMAL HIGH (ref 70–99)
POTASSIUM: 3.3 mmol/L — AB (ref 3.5–5.1)
SODIUM: 139 mmol/L (ref 135–145)
Total Bilirubin: 0.7 mg/dL (ref 0.3–1.2)
Total Protein: 7.2 g/dL (ref 6.5–8.1)

## 2018-02-25 LAB — CBC WITH DIFFERENTIAL/PLATELET
ABS IMMATURE GRANULOCYTES: 0.09 10*3/uL — AB (ref 0.00–0.07)
BASOS PCT: 0 %
Basophils Absolute: 0 10*3/uL (ref 0.0–0.1)
Eosinophils Absolute: 0 10*3/uL (ref 0.0–0.5)
Eosinophils Relative: 0 %
HCT: 38.7 % (ref 36.0–46.0)
Hemoglobin: 12.5 g/dL (ref 12.0–15.0)
IMMATURE GRANULOCYTES: 1 %
Lymphocytes Relative: 7 %
Lymphs Abs: 0.8 10*3/uL (ref 0.7–4.0)
MCH: 28.1 pg (ref 26.0–34.0)
MCHC: 32.3 g/dL (ref 30.0–36.0)
MCV: 87 fL (ref 80.0–100.0)
Monocytes Absolute: 0.2 10*3/uL (ref 0.1–1.0)
Monocytes Relative: 1 %
NEUTROS ABS: 10.9 10*3/uL — AB (ref 1.7–7.7)
NEUTROS PCT: 91 %
NRBC: 0 % (ref 0.0–0.2)
PLATELETS: 302 10*3/uL (ref 150–400)
RBC: 4.45 MIL/uL (ref 3.87–5.11)
RDW: 13.1 % (ref 11.5–15.5)
WBC: 12 10*3/uL — AB (ref 4.0–10.5)

## 2018-02-25 LAB — TROPONIN I

## 2018-02-25 LAB — LIPASE, BLOOD: LIPASE: 26 U/L (ref 11–51)

## 2018-02-25 MED ORDER — ONDANSETRON HCL 4 MG/2ML IJ SOLN
4.0000 mg | Freq: Once | INTRAMUSCULAR | Status: AC
  Administered 2018-02-25: 4 mg via INTRAVENOUS
  Filled 2018-02-25: qty 2

## 2018-02-25 MED ORDER — SODIUM CHLORIDE 0.9 % IV SOLN
1.0000 g | Freq: Once | INTRAVENOUS | Status: AC
  Administered 2018-02-25: 1 g via INTRAVENOUS
  Filled 2018-02-25: qty 10

## 2018-02-25 MED ORDER — SODIUM CHLORIDE 0.9 % IV BOLUS
1000.0000 mL | Freq: Once | INTRAVENOUS | Status: AC
  Administered 2018-02-25: 1000 mL via INTRAVENOUS

## 2018-02-25 MED ORDER — CEPHALEXIN 500 MG PO CAPS: 500.0000 mg | ORAL_CAPSULE | Freq: Three times a day (TID) | ORAL | 0 refills | Status: AC

## 2018-02-25 MED ORDER — ONDANSETRON 4 MG PO TBDP: 4.0000 mg | ORAL_TABLET | Freq: Three times a day (TID) | ORAL | 0 refills | Status: DC | PRN

## 2018-02-25 NOTE — ED Triage Notes (Signed)
Pt presents via pov from [facility] with her son, who states she has had nausea x 2 days, emesis today, and altered mental status since yesterday. Son states that she has dementia but that she is much more disoriented than usual. Pt alert, oriented to self. NAD Noted.

## 2018-02-25 NOTE — ED Notes (Signed)
Pt back from x-ray.

## 2018-02-25 NOTE — ED Triage Notes (Signed)
Pt sent by Dr. Patterson Hammersmith for possible dehydration and uti.

## 2018-02-25 NOTE — ED Provider Notes (Signed)
[facility] Emergency Department Provider Note  ____________________________________________  Time seen: Approximately 12:03 PM  I have reviewed the triage vital signs and the nursing notes.   HISTORY  Chief Complaint Nausea and Altered Mental Status  Level 5 caveat:  Portions of the history and physical were unable to be obtained due to dementia   HPI Kathy Hernandez is a 84 y.o. female with a history of Alzheimer's, hypertension, GERD, peptic ulcer disease, recurrent UTIs who presents for evaluation of nausea, vomiting and confusion.  According to the son patient was in her usual state of health on Monday when he visited her.  Yesterday patient started to become confused.  Did not want to leave her room, did not eat her meals until dinnertime.  She kept complaining of nausea.  She was slightly more confused than baseline.  This morning patient had several episodes of nonbloody nonbilious emesis.  Her confusion was worsening and son requested that patient be brought to the ED for evaluation. SNF sent urine for analysis yesterday which was positive for UTI however patient has not been started on antibiotics again.  She has had no fever or chills.  Patient denies flank pain, abdominal pain, chest pain, shortness of breath, headache.  Past Medical History:  Diagnosis Date  . GERD (gastroesophageal reflux disease)   . Hypertension   . Memory loss     Patient Active Problem List   Diagnosis Date Noted  . Nausea with vomiting 12/14/2013  . Diverticulitis of colon (without mention of hemorrhage)(562.11) 12/14/2013    Past Surgical History:  Procedure Laterality Date  . CHOLECYSTECTOMY    . COLONOSCOPY  2010  . HERNIA REPAIR  2012    Prior to Admission medications   Medication Sig Start Date End Date Taking? Authorizing Provider  acetaminophen (TYLENOL) 500 MG tablet Take 500 mg by mouth 3 (three) times daily.   Yes [provider]  aspirin EC  81 MG tablet Take 81 mg by mouth daily.   Yes [provider]  donepezil (ARICEPT) 10 MG tablet Take 10 mg by mouth at bedtime.   Yes [provider]  esomeprazole (NEXIUM) 40 MG capsule Take 40 mg by mouth daily.    Yes [provider]  hydrocortisone 2.5 % cream Apply 1 application topically 2 (two) times daily as needed (itching).   Yes [provider]  lisinopril (PRINIVIL,ZESTRIL) 20 MG tablet Take 20 mg by mouth daily.  10/14/13  Yes [provider]  Melatonin 3 MG TABS Take 3 mg by mouth at bedtime.   Yes [provider]  memantine (NAMENDA) 10 MG tablet Take 10 mg by mouth 2 (two) times daily.    Yes [provider]  vitamin B-12 (CYANOCOBALAMIN) 1000 MCG tablet Take 2,500 mcg by mouth daily.   Yes [provider]  cephALEXin (KEFLEX) 500 MG capsule Take 1 capsule (500 mg total) by mouth 3 (three) times daily for 7 days. 02/25/18 03/04/18  Nita Sickle, MD    Allergies Patient has no known allergies.  Family History  Problem Relation Age of Onset  . Tuberculosis Mother     Social History Social History   Tobacco Use  . Smoking status: Never Smoker  . Smokeless tobacco: Never Used  Substance Use Topics  . Alcohol use: No  . Drug use: No    Review of Systems  Constitutional: Negative for fever. + confusion Eyes: Negative for visual changes. ENT: Negative for sore throat. Neck: No  neck pain  Cardiovascular: Negative for chest pain. Respiratory: Negative for shortness of breath. Gastrointestinal: Negative for abdominal pain or diarrhea. + nausea and vomiting Genitourinary: Negative for dysuria. Musculoskeletal: Negative for back pain. Skin: Negative for rash. Neurological: Negative for headaches, weakness or numbness. Psych: No SI or HI  ____________________________________________   PHYSICAL EXAM:  VITAL SIGNS: ED Triage Vitals  Enc Vitals Group     BP 02/25/18 1134 (!) 204/90      Pulse Rate 02/25/18 1134 90     Resp 02/25/18 1134 18     Temp 02/25/18 1134 98.9 F (37.2 C)     Temp Source 02/25/18 1134 Oral     SpO2 02/25/18 1134 95 %     Weight 02/25/18 1135 225 lb 1.4 oz (102.1 kg)     Height 02/25/18 1135 5\' 6"  (1.676 m)     Head Circumference --      Peak Flow --      Pain Score 02/25/18 1135 0     Pain Loc --      Pain Edu? --      Excl. in GC? --     Constitutional: Alert and oriented x 2. Well appearing and in no apparent distress. HEENT:      Head: Normocephalic and atraumatic.         Eyes: Conjunctivae are normal. Sclera is non-icteric.       Mouth/Throat: Mucous membranes are moist.       Neck: Supple with no signs of meningismus. Cardiovascular: Regular rate and rhythm. No murmurs, gallops, or rubs. 2+ symmetrical distal pulses are present in all extremities. No JVD. Respiratory: Normal respiratory effort. Lungs are clear to auscultation bilaterally. No wheezes, crackles, or rhonchi.  Gastrointestinal: Soft, non tender, and non distended with positive bowel sounds. No rebound or guarding. Genitourinary: No CVA tenderness. Musculoskeletal: Nontender with normal range of motion in all extremities. No edema, cyanosis, or erythema of extremities. Neurologic: Normal speech and language.  Face symmetric, extraocular movements are intact, pupils are equal round and reactive to light, strength and sensation intact x4, no pronator drift, no dysmetria on finger-to-nose finger.   Skin: Skin is warm, dry and intact. No rash noted. Psychiatric: Mood and affect are normal. Speech and behavior are normal.  ____________________________________________   LABS (all labs ordered are listed, but only abnormal results are displayed)  Labs Reviewed  COMPREHENSIVE METABOLIC PANEL - Abnormal; Notable for the following components:      Result Value   Potassium 3.3 (*)    Glucose, Bld 154 (*)    Calcium 8.7 (*)    All other components within normal limits  CBC  WITH DIFFERENTIAL/PLATELET - Abnormal; Notable for the following components:   WBC 12.0 (*)    Neutro Abs 10.9 (*)    Abs Immature Granulocytes 0.09 (*)    All other components within normal limits  URINALYSIS, COMPLETE (UACMP) WITH MICROSCOPIC - Abnormal; Notable for the following components:   Color, Urine YELLOW (*)    APPearance HAZY (*)    Hgb urine dipstick SMALL (*)    Ketones, ur 20 (*)    Bacteria, UA MANY (*)    All other components within normal limits  URINE CULTURE  LIPASE, BLOOD  TROPONIN I  CBG MONITORING, ED   ____________________________________________  EKG  ED ECG REPORT I, Nita Sickle, the attending physician, personally viewed and interpreted this ECG.  Normal sinus rhythm, rate of 82, normal intervals, normal axis, no ST elevations or  depressions, Q wave in leads III and V3.  Unchanged from prior from 2017. ____________________________________________  RADIOLOGY  I have personally reviewed the images performed during this visit and I agree with the Radiologist's read.   Interpretation by Radiologist:  Dg Abdomen Acute W/chest  Result Date: 02/25/2018 CLINICAL DATA:  Nausea.  Emesis.  Altered mental status. EXAM: DG ABDOMEN ACUTE W/ 1V CHEST COMPARISON:  Lumbar spine 05/07/2017. FINDINGS: Mediastinum and hilar structures normal. Mild bibasilar atelectasis. Heart size normal. Surgical clips right upper quadrant and pelvis. No bowel distention or free air. Degenerative change thoracolumbar spine. IMPRESSION: 1.  Mild bibasilar atelectasis. 2.  No acute intra-abdominal abnormality. Electronically Signed   By: Maisie Fus  Register   On: 02/25/2018 13:02     ____________________________________________   PROCEDURES  Procedure(s) performed: None Procedures Critical Care performed:  None ____________________________________________   INITIAL IMPRESSION / ASSESSMENT AND PLAN / ED COURSE  84 y.o. female with a history of Alzheimer's, hypertension,  GERD, peptic ulcer disease, recurrent UTIs who presents for evaluation of nausea, vomiting and confusion.  Patient is well-appearing, alert and oriented x2, very pleasant and in no distress.  Initial vitals documented in triage showed severely elevated blood pressure however upon arrival to the room blood pressures trending down.  She did take her morning meds but vomited 20 to 30 minutes afterwards this morning.  Her abdomen is soft with no tenderness throughout, she has normal bowel sounds, no CVA tenderness.  She is completely neurologically intact with no signs of stroke.  It seems like skilled nursing facility sent a urinalysis yesterday which was positive for UTI and could therefore be causing patient's symptoms.  I will recheck her urine.  At this time she does not meet sepsis criteria.  Will check labs as well to rule out electrolyte abnormalities, dehydration, AKI, sepsis as possible causes of patient's symptoms.  Her EKG shows no acute ischemic changes.  At this time there is no signs of stroke.    _________________________ 2:43 PM on 02/25/2018 ----------------------------------------- UA positive for UTI.  WBC mildly elevated.  Patient remains afebrile.  No signs of sepsis at this time.  Review of epic shows the patient's urinalysis usually grow Klebsiella that is pansensitive.  Patient was given a dose of Rocephin and will be sent home on Keflex.  Discussed return precautions and close follow up with son   As part of my medical decision making, I reviewed the following data within the electronic MEDICAL RECORD NUMBER History obtained from family, Nursing notes reviewed and incorporated, Labs reviewed , EKG interpreted , Old chart reviewed, Radiograph reviewed , Notes from prior ED visits and [place] Controlled Substance Database    Pertinent labs & imaging results that were available during my care of the patient were reviewed by me and considered in my medical decision making (see chart for  details).    ____________________________________________   FINAL CLINICAL IMPRESSION(S) / ED DIAGNOSES  Final diagnoses:  Confusion  Non-intractable vomiting with nausea, unspecified vomiting type  Acute cystitis with hematuria      NEW MEDICATIONS STARTED DURING THIS VISIT:  ED Discharge Orders         Ordered    cephALEXin (KEFLEX) 500 MG capsule  3 times daily     02/25/18 1435           Note:  This document was prepared using Dragon voice recognition software and may include unintentional dictation errors.    Nita Sickle, MD 02/25/18 [phone]

## 2018-02-25 NOTE — Discharge Instructions (Signed)

## 2018-02-25 NOTE — ED Notes (Signed)
Pt taken to xray via stretcher  

## 2018-02-25 NOTE — ED Notes (Signed)
MD at bedside. 

## 2018-03-01 LAB — URINE CULTURE

## 2018-03-04 DIAGNOSIS — N3000 Acute cystitis without hematuria: Secondary | ICD-10-CM | POA: Diagnosis not present

## 2018-03-04 DIAGNOSIS — E876 Hypokalemia: Secondary | ICD-10-CM | POA: Diagnosis not present

## 2018-03-04 DIAGNOSIS — G301 Alzheimer's disease with late onset: Secondary | ICD-10-CM | POA: Diagnosis not present

## 2018-03-04 DIAGNOSIS — L570 Actinic keratosis: Secondary | ICD-10-CM | POA: Diagnosis not present

## 2018-03-04 DIAGNOSIS — F0280 Dementia in other diseases classified elsewhere without behavioral disturbance: Secondary | ICD-10-CM | POA: Diagnosis not present

## 2018-06-24 DIAGNOSIS — E538 Deficiency of other specified B group vitamins: Secondary | ICD-10-CM | POA: Diagnosis not present

## 2018-06-24 DIAGNOSIS — I10 Essential (primary) hypertension: Secondary | ICD-10-CM | POA: Diagnosis not present

## 2018-06-24 DIAGNOSIS — R21 Rash and other nonspecific skin eruption: Secondary | ICD-10-CM | POA: Diagnosis not present

## 2018-11-11 DIAGNOSIS — R319 Hematuria, unspecified: Secondary | ICD-10-CM | POA: Diagnosis not present

## 2018-11-11 DIAGNOSIS — N390 Urinary tract infection, site not specified: Secondary | ICD-10-CM | POA: Diagnosis not present

## 2018-11-11 DIAGNOSIS — Z79899 Other long term (current) drug therapy: Secondary | ICD-10-CM | POA: Diagnosis not present

## 2018-12-01 DIAGNOSIS — L281 Prurigo nodularis: Secondary | ICD-10-CM | POA: Diagnosis not present

## 2018-12-22 DIAGNOSIS — L559 Sunburn, unspecified: Secondary | ICD-10-CM | POA: Diagnosis not present

## 2019-05-20 DIAGNOSIS — U071 COVID-19: Secondary | ICD-10-CM | POA: Diagnosis not present

## 2019-05-27 DIAGNOSIS — U071 COVID-19: Secondary | ICD-10-CM | POA: Diagnosis not present

## 2019-06-01 DIAGNOSIS — U071 COVID-19: Secondary | ICD-10-CM | POA: Diagnosis not present

## 2019-06-09 DIAGNOSIS — U071 COVID-19: Secondary | ICD-10-CM | POA: Diagnosis not present

## 2019-06-22 DIAGNOSIS — U071 COVID-19: Secondary | ICD-10-CM | POA: Diagnosis not present

## 2019-07-06 DIAGNOSIS — U071 COVID-19: Secondary | ICD-10-CM | POA: Diagnosis not present

## 2019-07-14 DIAGNOSIS — U071 COVID-19: Secondary | ICD-10-CM | POA: Diagnosis not present

## 2019-07-20 DIAGNOSIS — U071 COVID-19: Secondary | ICD-10-CM | POA: Diagnosis not present

## 2019-08-06 DIAGNOSIS — U071 COVID-19: Secondary | ICD-10-CM | POA: Diagnosis not present

## 2019-08-11 DIAGNOSIS — U071 COVID-19: Secondary | ICD-10-CM | POA: Diagnosis not present

## 2019-09-03 DIAGNOSIS — L989 Disorder of the skin and subcutaneous tissue, unspecified: Secondary | ICD-10-CM | POA: Diagnosis not present

## 2019-09-03 DIAGNOSIS — I10 Essential (primary) hypertension: Secondary | ICD-10-CM | POA: Diagnosis not present

## 2019-09-03 DIAGNOSIS — F0280 Dementia in other diseases classified elsewhere without behavioral disturbance: Secondary | ICD-10-CM | POA: Diagnosis not present

## 2019-09-03 DIAGNOSIS — G301 Alzheimer's disease with late onset: Secondary | ICD-10-CM | POA: Diagnosis not present

## 2019-10-22 DIAGNOSIS — E519 Thiamine deficiency, unspecified: Secondary | ICD-10-CM | POA: Diagnosis not present

## 2019-10-22 DIAGNOSIS — G309 Alzheimer's disease, unspecified: Secondary | ICD-10-CM | POA: Diagnosis not present

## 2019-10-22 DIAGNOSIS — E538 Deficiency of other specified B group vitamins: Secondary | ICD-10-CM | POA: Diagnosis not present

## 2019-10-22 DIAGNOSIS — F0280 Dementia in other diseases classified elsewhere without behavioral disturbance: Secondary | ICD-10-CM | POA: Diagnosis not present

## 2019-10-22 DIAGNOSIS — G301 Alzheimer's disease with late onset: Secondary | ICD-10-CM | POA: Diagnosis not present

## 2019-10-22 DIAGNOSIS — E559 Vitamin D deficiency, unspecified: Secondary | ICD-10-CM | POA: Diagnosis not present

## 2019-10-22 DIAGNOSIS — F0150 Vascular dementia without behavioral disturbance: Secondary | ICD-10-CM | POA: Diagnosis not present

## 2019-11-22 ENCOUNTER — Emergency Department
Admission: EM | Admit: 2019-11-22 | Discharge: 2019-11-22 | Disposition: A | Discharge status: Home / Self Care | Payer: PPO | Attending: Emergency Medicine | Admitting: Emergency Medicine

## 2019-11-22 ENCOUNTER — Encounter: Payer: Self-pay | Admitting: Emergency Medicine

## 2019-11-22 ENCOUNTER — Other Ambulatory Visit: Payer: Self-pay

## 2019-11-22 DIAGNOSIS — K219 Gastro-esophageal reflux disease without esophagitis: Secondary | ICD-10-CM | POA: Diagnosis not present

## 2019-11-22 DIAGNOSIS — I10 Essential (primary) hypertension: Secondary | ICD-10-CM | POA: Diagnosis not present

## 2019-11-22 DIAGNOSIS — F0390 Unspecified dementia without behavioral disturbance: Secondary | ICD-10-CM | POA: Insufficient documentation

## 2019-11-22 DIAGNOSIS — R29810 Facial weakness: Secondary | ICD-10-CM | POA: Diagnosis not present

## 2019-11-22 DIAGNOSIS — Z79899 Other long term (current) drug therapy: Secondary | ICD-10-CM | POA: Diagnosis not present

## 2019-11-22 DIAGNOSIS — Z7982 Long term (current) use of aspirin: Secondary | ICD-10-CM | POA: Diagnosis not present

## 2019-11-22 DIAGNOSIS — R531 Weakness: Secondary | ICD-10-CM | POA: Diagnosis not present

## 2019-11-22 LAB — BASIC METABOLIC PANEL
Anion gap: 7 (ref 5–15)
BUN: 11 mg/dL (ref 8–23)
CO2: 28 mmol/L (ref 22–32)
Calcium: 8.6 mg/dL — ABNORMAL LOW (ref 8.9–10.3)
Chloride: 103 mmol/L (ref 98–111)
Creatinine, Ser: 0.69 mg/dL (ref 0.44–1.00)
GFR calc Af Amer: >60 mL/min (ref >60)
GFR calc non Af Amer: >60 mL/min (ref >60)
Glucose, Bld: 129 mg/dL — ABNORMAL HIGH (ref 70–99)
Potassium: 3.5 mmol/L (ref 3.5–5.1)
Sodium: 138 mmol/L (ref 135–145)

## 2019-11-22 LAB — CBC
HCT: 38.7 % (ref 36.0–46.0)
Hemoglobin: 12.6 g/dL (ref 12.0–15.0)
MCH: 28.4 pg (ref 26.0–34.0)
MCHC: 32.6 g/dL (ref 30.0–36.0)
MCV: 87.2 fL (ref 80.0–100.0)
Platelets: 293 10*3/uL (ref 150–400)
RBC: 4.44 MIL/uL (ref 3.87–5.11)
RDW: 13.1 % (ref 11.5–15.5)
WBC: 8.3 10*3/uL (ref 4.0–10.5)
nRBC: 0 % (ref 0.0–0.2)

## 2019-11-22 LAB — TROPONIN I (HIGH SENSITIVITY): Troponin I (High Sensitivity): 4 ng/L (ref <18)

## 2019-11-22 NOTE — ED Notes (Signed)
E-signature not working at this time. Pt verbalized understanding of D/C instructions, prescriptions and follow up care with no further questions at this time. Pt in NAD and ambulatory at time of D/C.  

## 2019-11-22 NOTE — ED Triage Notes (Signed)
Pt in via EMS from [facility] asst living with c/o HTN. Pt has hx of the same. Facility denies other symptoms. Pt ddi take lisinopril this am  BP 185/60 this am. Facility called 911, BP 167/74. Pt with hx of dementia.

## 2019-11-22 NOTE — ED Triage Notes (Signed)
Pt reports her BP is elevated. Pt denies pain, SOB or other sx's.

## 2019-11-22 NOTE — ED Provider Notes (Signed)
[facility] Emergency Department Provider Note   ____________________________________________   First MD Initiated Contact with Patient 11/22/19 1105     (approximate)  I have reviewed the triage vital signs and the nursing notes.   HISTORY  Chief Complaint Hypertension    HPI Kathy Hernandez is a 86 y.o. female with past medical history of hypertension, GERD, and dementia who presents to the ED for hypertension.  Patient reports she is feeling fine, but was told her blood pressure was high by staff at [facility] this morning and she needed to go to the ER for evaluation.  She reports taking her blood pressure medication this morning and has not missed any doses recently.  She denies any headache, vision changes, numbness, weakness, chest pain, or difficulty urinating.  She states she is not sure why she needed to come to the ER today.        Past Medical History:  Diagnosis Date  . GERD (gastroesophageal reflux disease)   . Hypertension   . Memory loss     Patient Active Problem List   Diagnosis Date Noted  . Nausea with vomiting 12/14/2013  . Diverticulitis of colon (without mention of hemorrhage)(562.11) 12/14/2013    Past Surgical History:  Procedure Laterality Date  . CHOLECYSTECTOMY    . COLONOSCOPY  2010  . HERNIA REPAIR  2012    Prior to Admission medications   Medication Sig Start Date End Date Taking? Authorizing Provider  acetaminophen (TYLENOL) 500 MG tablet Take 500 mg by mouth 3 (three) times daily.    [provider]  aspirin EC 81 MG tablet Take 81 mg by mouth daily.    [provider]  donepezil (ARICEPT) 10 MG tablet Take 10 mg by mouth at bedtime.    [provider]  esomeprazole (NEXIUM) 40 MG capsule Take 40 mg by mouth daily.     [provider]  hydrocortisone 2.5 % cream Apply 1 application topically 2 (two) times daily as needed (itching).    [provider]    lisinopril (PRINIVIL,ZESTRIL) 20 MG tablet Take 20 mg by mouth daily.  10/14/13   [provider]  Melatonin 3 MG TABS Take 3 mg by mouth at bedtime.    [provider]  memantine (NAMENDA) 10 MG tablet Take 10 mg by mouth 2 (two) times daily.     [provider]  ondansetron (ZOFRAN ODT) 4 MG disintegrating tablet Take 1 tablet (4 mg total) by mouth every 8 (eight) hours as needed for nausea or vomiting. 02/25/18   Nita Sickle, MD  vitamin B-12 (CYANOCOBALAMIN) 1000 MCG tablet Take 2,500 mcg by mouth daily.    [provider]    Allergies Patient has no known allergies.  Family History  Problem Relation Age of Onset  . Tuberculosis Mother     Social History Social History   Tobacco Use  . Smoking status: Never Smoker  . Smokeless tobacco: Never Used  Vaping Use  . Vaping Use: Never used  Substance Use Topics  . Alcohol use: No  . Drug use: No    Review of Systems  Constitutional: No fever/chills.  Positive for elevated BP. Eyes: No visual changes. ENT: No sore throat. Cardiovascular: Denies chest pain. Respiratory: Denies shortness of breath. Gastrointestinal: No abdominal pain.  No nausea, no vomiting.  No diarrhea.  No constipation. Genitourinary: Negative for dysuria. Musculoskeletal: Negative for back pain. Skin: Negative for rash. Neurological: Negative for headaches, focal weakness  or numbness.  ____________________________________________   PHYSICAL EXAM:  VITAL SIGNS: ED Triage Vitals  Enc Vitals Group     BP 11/22/19 0956 (!) 204/90     Pulse Rate 11/22/19 0956 70     Resp 11/22/19 0956 18     Temp 11/22/19 0956 98 F (36.7 C)     Temp Source 11/22/19 0956 Oral     SpO2 11/22/19 0956 97 %     Weight 11/22/19 1004 (!) 220 lb (99.8 kg)     Height 11/22/19 1004 5\' 6"  (1.676 m)     Head Circumference --      Peak Flow --      Pain Score 11/22/19 1004 0     Pain Loc --      Pain Edu? --      Excl. in GC? --      Constitutional: Awake and alert. Eyes: Conjunctivae are normal. Head: Atraumatic. Nose: No congestion/rhinnorhea. Mouth/Throat: Mucous membranes are moist. Neck: Normal ROM Cardiovascular: Normal rate, regular rhythm. Grossly normal heart sounds. Respiratory: Normal respiratory effort.  No retractions. Lungs CTAB. Gastrointestinal: Soft and nontender. No distention. Genitourinary: deferred Musculoskeletal: No lower extremity tenderness nor edema. Neurologic:  Normal speech and language. No gross focal neurologic deficits are appreciated. Skin:  Skin is warm, dry and intact. No rash noted. Psychiatric: Mood and affect are normal. Speech and behavior are normal.  ____________________________________________   LABS (all labs ordered are listed, but only abnormal results are displayed)  Labs Reviewed  BASIC METABOLIC PANEL - Abnormal; Notable for the following components:      Result Value   Glucose, Bld 129 (*)    Calcium 8.6 (*)    All other components within normal limits  CBC  TROPONIN I (HIGH SENSITIVITY)   ____________________________________________  EKG  ED ECG REPORT I, 01/22/20, the attending physician, personally viewed and interpreted this ECG.   Date: 11/22/2019  EKG Time: 9:50  Rate: 67  Rhythm: normal sinus rhythm  Axis: Normal  Intervals:none  ST&T Change: None   PROCEDURES  Procedure(s) performed (including Critical Care):  Procedures   ____________________________________________   INITIAL IMPRESSION / ASSESSMENT AND PLAN / ED COURSE       86 year old female with past medical history of dementia, hypertension, and GERD presents to the ED for elevated blood pressure noted on regular check at her nursing facility earlier today.  She states she is feeling well with no complaints at this time and there are no focal neurologic deficits on exam.  EKG and labs are unremarkable, there is no evidence of hypertensive emergency at this time.   No dictation for further management of asymptomatic hypertension here in the ED, patient is appropriate for discharge home with PCP follow-up.      ____________________________________________   FINAL CLINICAL IMPRESSION(S) / ED DIAGNOSES  Final diagnoses:  Essential hypertension     ED Discharge Orders    None       Note:  This document was prepared using Dragon voice recognition software and may include unintentional dictation errors.   98, MD 11/22/19 [phone]

## 2019-11-24 DIAGNOSIS — I10 Essential (primary) hypertension: Secondary | ICD-10-CM | POA: Diagnosis not present

## 2019-12-08 DIAGNOSIS — I10 Essential (primary) hypertension: Secondary | ICD-10-CM | POA: Diagnosis not present

## 2019-12-08 DIAGNOSIS — R600 Localized edema: Secondary | ICD-10-CM | POA: Diagnosis not present

## 2020-03-13 DIAGNOSIS — M25551 Pain in right hip: Secondary | ICD-10-CM | POA: Diagnosis not present

## 2020-03-13 DIAGNOSIS — I10 Essential (primary) hypertension: Secondary | ICD-10-CM | POA: Diagnosis not present

## 2020-03-13 DIAGNOSIS — F0280 Dementia in other diseases classified elsewhere without behavioral disturbance: Secondary | ICD-10-CM | POA: Diagnosis not present

## 2020-03-13 DIAGNOSIS — G301 Alzheimer's disease with late onset: Secondary | ICD-10-CM | POA: Diagnosis not present

## 2021-03-29 DIAGNOSIS — G301 Alzheimer's disease with late onset: Secondary | ICD-10-CM | POA: Diagnosis not present

## 2021-03-29 DIAGNOSIS — Z23 Encounter for immunization: Secondary | ICD-10-CM | POA: Diagnosis not present

## 2021-03-29 DIAGNOSIS — L02422 Furuncle of left axilla: Secondary | ICD-10-CM | POA: Diagnosis not present

## 2021-03-29 DIAGNOSIS — F0280 Dementia in other diseases classified elsewhere without behavioral disturbance: Secondary | ICD-10-CM | POA: Diagnosis not present

## 2021-03-29 DIAGNOSIS — I10 Essential (primary) hypertension: Secondary | ICD-10-CM | POA: Diagnosis not present

## 2023-11-29 ENCOUNTER — Other Ambulatory Visit: Payer: Self-pay

## 2023-11-29 ENCOUNTER — Emergency Department

## 2023-11-29 ENCOUNTER — Inpatient Hospital Stay
Admission: EM | Admit: 2023-11-29 | Discharge: 2023-12-04 | DRG: 884 | Disposition: A | Discharge status: Skilled Nursing Facility | Source: Skilled Nursing Facility | Attending: Hospitalist | Admitting: Hospitalist

## 2023-11-29 ENCOUNTER — Encounter: Payer: Self-pay | Admitting: Internal Medicine

## 2023-11-29 ENCOUNTER — Observation Stay

## 2023-11-29 DIAGNOSIS — R609 Edema, unspecified: Secondary | ICD-10-CM | POA: Diagnosis present

## 2023-11-29 DIAGNOSIS — Z7982 Long term (current) use of aspirin: Secondary | ICD-10-CM

## 2023-11-29 DIAGNOSIS — R4182 Altered mental status, unspecified: Secondary | ICD-10-CM | POA: Diagnosis present

## 2023-11-29 DIAGNOSIS — R339 Retention of urine, unspecified: Secondary | ICD-10-CM | POA: Diagnosis present

## 2023-11-29 DIAGNOSIS — F0390 Unspecified dementia without behavioral disturbance: Principal | ICD-10-CM | POA: Diagnosis present

## 2023-11-29 DIAGNOSIS — R296 Repeated falls: Secondary | ICD-10-CM | POA: Diagnosis present

## 2023-11-29 DIAGNOSIS — I700 Atherosclerosis of aorta: Secondary | ICD-10-CM | POA: Diagnosis present

## 2023-11-29 DIAGNOSIS — M6282 Rhabdomyolysis: Secondary | ICD-10-CM | POA: Diagnosis present

## 2023-11-29 DIAGNOSIS — T50905A Adverse effect of unspecified drugs, medicaments and biological substances, initial encounter: Secondary | ICD-10-CM

## 2023-11-29 DIAGNOSIS — R627 Adult failure to thrive: Principal | ICD-10-CM | POA: Diagnosis present

## 2023-11-29 DIAGNOSIS — N390 Urinary tract infection, site not specified: Secondary | ICD-10-CM | POA: Insufficient documentation

## 2023-11-29 DIAGNOSIS — R5381 Other malaise: Secondary | ICD-10-CM | POA: Diagnosis present

## 2023-11-29 DIAGNOSIS — R404 Transient alteration of awareness: Secondary | ICD-10-CM | POA: Diagnosis not present

## 2023-11-29 DIAGNOSIS — R748 Abnormal levels of other serum enzymes: Secondary | ICD-10-CM | POA: Insufficient documentation

## 2023-11-29 DIAGNOSIS — Z831 Family history of other infectious and parasitic diseases: Secondary | ICD-10-CM

## 2023-11-29 DIAGNOSIS — I10 Essential (primary) hypertension: Secondary | ICD-10-CM | POA: Diagnosis present

## 2023-11-29 DIAGNOSIS — Z79899 Other long term (current) drug therapy: Secondary | ICD-10-CM

## 2023-11-29 DIAGNOSIS — G4700 Insomnia, unspecified: Secondary | ICD-10-CM | POA: Diagnosis present

## 2023-11-29 DIAGNOSIS — S0003XA Contusion of scalp, initial encounter: Secondary | ICD-10-CM | POA: Diagnosis present

## 2023-11-29 DIAGNOSIS — Z66 Do not resuscitate: Secondary | ICD-10-CM | POA: Diagnosis present

## 2023-11-29 DIAGNOSIS — K219 Gastro-esophageal reflux disease without esophagitis: Secondary | ICD-10-CM | POA: Diagnosis present

## 2023-11-29 DIAGNOSIS — E876 Hypokalemia: Secondary | ICD-10-CM | POA: Diagnosis not present

## 2023-11-29 DIAGNOSIS — W19XXXA Unspecified fall, initial encounter: Principal | ICD-10-CM | POA: Diagnosis present

## 2023-11-29 DIAGNOSIS — Z515 Encounter for palliative care: Secondary | ICD-10-CM

## 2023-11-29 DIAGNOSIS — R471 Dysarthria and anarthria: Secondary | ICD-10-CM | POA: Diagnosis present

## 2023-11-29 LAB — CBC WITH DIFFERENTIAL/PLATELET
Abs Immature Granulocytes: 0.06 K/uL (ref 0.00–0.07)
Basophils Absolute: 0 K/uL (ref 0.0–0.1)
Basophils Relative: 0 %
Eosinophils Absolute: 0.2 K/uL (ref 0.0–0.5)
Eosinophils Relative: 2 %
HCT: 34.8 % — ABNORMAL LOW (ref 36.0–46.0)
Hemoglobin: 11.3 g/dL — ABNORMAL LOW (ref 12.0–15.0)
Immature Granulocytes: 1 %
Lymphocytes Relative: 8 %
Lymphs Abs: 0.8 K/uL (ref 0.7–4.0)
MCH: 29.9 pg (ref 26.0–34.0)
MCHC: 32.5 g/dL (ref 30.0–36.0)
MCV: 92.1 fL (ref 80.0–100.0)
Monocytes Absolute: 0.5 K/uL (ref 0.1–1.0)
Monocytes Relative: 4 %
Neutro Abs: 8.9 K/uL — ABNORMAL HIGH (ref 1.7–7.7)
Neutrophils Relative %: 85 %
Platelets: 267 K/uL (ref 150–400)
RBC: 3.78 MIL/uL — ABNORMAL LOW (ref 3.87–5.11)
RDW: 13.2 % (ref 11.5–15.5)
WBC: 10.4 K/uL (ref 4.0–10.5)
nRBC: 0 % (ref 0.0–0.2)

## 2023-11-29 LAB — TROPONIN I (HIGH SENSITIVITY)
Troponin I (High Sensitivity): 4 ng/L (ref <18)
Troponin I (High Sensitivity): 5 ng/L (ref <18)

## 2023-11-29 LAB — URINALYSIS, ROUTINE W REFLEX MICROSCOPIC
Bilirubin Urine: NEGATIVE
Glucose, UA: NEGATIVE mg/dL
Hgb urine dipstick: NEGATIVE
Ketones, ur: 20 mg/dL — AB
Leukocytes,Ua: NEGATIVE
Nitrite: NEGATIVE
Protein, ur: NEGATIVE mg/dL
Specific Gravity, Urine: 1.014 (ref 1.005–1.030)
pH: 5 (ref 5.0–8.0)

## 2023-11-29 LAB — COMPREHENSIVE METABOLIC PANEL WITH GFR
ALT: 16 U/L (ref 0–44)
AST: 25 U/L (ref 15–41)
Albumin: 3.6 g/dL (ref 3.5–5.0)
Alkaline Phosphatase: 67 U/L (ref 38–126)
Anion gap: 8 (ref 5–15)
BUN: 17 mg/dL (ref 8–23)
CO2: 26 mmol/L (ref 22–32)
Calcium: 8.7 mg/dL — ABNORMAL LOW (ref 8.9–10.3)
Chloride: 103 mmol/L (ref 98–111)
Creatinine, Ser: 0.65 mg/dL (ref 0.44–1.00)
GFR, Estimated: >60 mL/min (ref >60)
Glucose, Bld: 122 mg/dL — ABNORMAL HIGH (ref 70–99)
Potassium: 3.9 mmol/L (ref 3.5–5.1)
Sodium: 137 mmol/L (ref 135–145)
Total Bilirubin: 0.8 mg/dL (ref 0.0–1.2)
Total Protein: 6.6 g/dL (ref 6.5–8.1)

## 2023-11-29 LAB — CK: Total CK: 335 U/L — ABNORMAL HIGH (ref 38–234)

## 2023-11-29 MED ORDER — ONDANSETRON HCL 4 MG PO TABS
4.0000 mg | ORAL_TABLET | Freq: Four times a day (QID) | ORAL | Status: AC | PRN
Start: 2023-11-29 — End: 2023-12-04

## 2023-11-29 MED ORDER — MEMANTINE HCL 5 MG PO TABS
10.0000 mg | ORAL_TABLET | Freq: Two times a day (BID) | ORAL | Status: DC
  Administered 2023-11-29 – 2023-12-04 (×17): 10 mg via ORAL
  Filled 2023-11-29 (×12): qty 2

## 2023-11-29 MED ORDER — ACETAMINOPHEN 650 MG RE SUPP: 650.0000 mg | Freq: Four times a day (QID) | RECTAL | Status: AC | PRN

## 2023-11-29 MED ORDER — ENOXAPARIN SODIUM 40 MG/0.4ML IJ SOSY
40.0000 mg | PREFILLED_SYRINGE | INTRAMUSCULAR | Status: DC
  Administered 2023-11-29 – 2023-12-04 (×9): 40 mg via SUBCUTANEOUS
  Filled 2023-11-29 (×6): qty 0.4

## 2023-11-29 MED ORDER — SODIUM CHLORIDE 0.9 % IV SOLN
2.0000 g | Freq: Once | INTRAVENOUS | Status: AC
  Administered 2023-11-29: 2 g via INTRAVENOUS
  Filled 2023-11-29: qty 20

## 2023-11-29 MED ORDER — SODIUM CHLORIDE 0.9 % IV SOLN
2.0000 g | INTRAVENOUS | Status: DC
  Filled 2023-11-29: qty 20

## 2023-11-29 MED ORDER — DONEPEZIL HCL 5 MG PO TABS
10.0000 mg | ORAL_TABLET | Freq: Every day | ORAL | Status: DC
  Administered 2023-11-29 – 2023-12-04 (×9): 10 mg via ORAL
  Filled 2023-11-29 (×6): qty 2

## 2023-11-29 MED ORDER — ONDANSETRON HCL 4 MG/2ML IJ SOLN: 4.0000 mg | Freq: Four times a day (QID) | INTRAMUSCULAR | Status: AC | PRN

## 2023-11-29 MED ORDER — ACETAMINOPHEN 325 MG PO TABS
650.0000 mg | ORAL_TABLET | Freq: Four times a day (QID) | ORAL | Status: AC | PRN
Start: 2023-11-29 — End: 2023-12-04
  Administered 2023-11-29 – 2023-12-04 (×4): 650 mg via ORAL
  Filled 2023-11-29 (×3): qty 2

## 2023-11-29 MED ORDER — MELATONIN 5 MG PO TABS
5.0000 mg | ORAL_TABLET | Freq: Every day | ORAL | Status: DC
  Administered 2023-11-29 – 2023-12-04 (×9): 5 mg via ORAL
  Filled 2023-11-29 (×6): qty 1

## 2023-11-29 MED ORDER — LACTATED RINGERS IV SOLN: INTRAVENOUS | Status: AC

## 2023-11-29 MED ORDER — SENNOSIDES-DOCUSATE SODIUM 8.6-50 MG PO TABS
1.0000 | ORAL_TABLET | Freq: Every evening | ORAL | Status: DC | PRN
Start: 2023-11-29 — End: 2023-12-05

## 2023-11-29 NOTE — Assessment & Plan Note (Signed)
 With altered mentation Differentials include secondary to ciprofloxacin versus infectious etiology including pneumonia, UTI Fall precaution, portable chest x-ray

## 2023-11-29 NOTE — ED Notes (Signed)
 Fall bundle in place. Non-skid socks & fall bracelet placed on pt. Bed alarm activated & audible.

## 2023-11-29 NOTE — Hospital Course (Signed)
 Ms. Kathy Hernandez is a 89 year old female with history of dementia, bilateral hip arthropathy right greater than left, GERD, insomnia, who comes to the ED via EMS from [facility] for chief concerns of altered mentation, increasing falls.  Per ED provider, [facility] stated that patient is currently being treated for urinary tract infection with a positive urine culture with ciprofloxacin.  Unclear the bacteria growth in the urine.  Vitals in the ED showed temperature of 98.2, respiration rate 14, heart rate 71, blood pressure 117/53, SpO2 98% on room air.  Serum sodium is 137, potassium 3.9, chloride 103, bicarb 26, BUN of 17, serum creatinine 0.65, EGFR greater than 60, nonfasting blood glucose 122, WBC 10.4, hemoglobin 11.3, platelets of 267.  CK is 335.  HS troponin is 4.  Hip x-ray: Read as no acute fracture or dislocation.  Advanced right and mild to moderate left hip arthropathy.  CT head wocontrast: Posterior scalp hematoma.  No acute intracranial abnormality.  No skull fracture.  Chronic small vessel ischemia.  Diffuse atrophy, temporal lobe predominant, progression from 2017.  CT cervical spine wo contrast: No acute fracture or subluxation of cervical spine.  Soft tissue edema posterior to the right shoulder.  ED treatment: Ceftriaxone  2 g IV one-time dose.

## 2023-11-29 NOTE — H&P (Signed)
 History and Physical   Danelly Hassinger Batta FMW:[id number] DOB: 12/30/1929 DOA: 11/29/2023  PCP: Jyl Railing, MD  Outpatient Specialists: Dr. Jannett Fairly, [facility] neurology Patient coming from: [facility] via EMS  I have personally briefly reviewed patient's old medical records in [facility] EMR.  Chief Concern: Altered mentation, falling episodes  HPI: Ms. Kathy Hernandez is a 89 year old female with history of dementia, bilateral hip arthropathy right greater than left, GERD, insomnia, who comes to the ED via EMS from [facility] for chief concerns of altered mentation, increasing falls.  Per ED provider, [facility] stated that patient is currently being treated for urinary tract infection with a positive urine culture with ciprofloxacin.  Unclear the bacteria growth in the urine.  Vitals in the ED showed temperature of 98.2, respiration rate 14, heart rate 71, blood pressure 117/53, SpO2 98% on room air.  Serum sodium is 137, potassium 3.9, chloride 103, bicarb 26, BUN of 17, serum creatinine 0.65, EGFR greater than 60, nonfasting blood glucose 122, WBC 10.4, hemoglobin 11.3, platelets of 267.  CK is 335.  HS troponin is 4.  Hip x-ray: Read as no acute fracture or dislocation.  Advanced right and mild to moderate left hip arthropathy.  CT head wocontrast: Posterior scalp hematoma.  No acute intracranial abnormality.  No skull fracture.  Chronic small vessel ischemia.  Diffuse atrophy, temporal lobe predominant, progression from 2017.  CT cervical spine wo contrast: No acute fracture or subluxation of cervical spine.  Soft tissue edema posterior to the right shoulder.  ED treatment: Ceftriaxone  2 g IV one-time dose. ------------------------------------ At bedside, patient murmurs incoherently.  She was able to tell me her first name.  She was not able to tell me her age, current calendar year, current location.  Son reports that patient has been more confused for the last  3 to 4 days.  He reports that the facility did not report nausea, vomiting, diarrhea, fever.  Social history: Patient currently lives at [facility] facility.  ROS: Unable to complete as patient is altered in setting of advanced dementia  ED Course: Discussed with EDP, patient requiring hospitalization for chief concerns of altered mentation, increasing falls.  Assessment/Plan  Principal Problem:   Altered mental status Active Problems:   Dementia without behavioral disturbance (HCC)   UTI (urinary tract infection)   Falling episodes   Elevated CK   Aortic atherosclerosis (HCC)   Assessment and Plan:  * Altered mental status Etiology workup in progress Differentials include secondary to ciprofloxacin versus infectious etiology including pneumonia, UTI Portable chest x-ray ordered on admission UA pending collection at this time Fall precaution, aspiration precaution  Elevated CK Likely mild rhabdomyolysis in setting of falls and has been down on the floor since last evening Unwitnessed fall LR infusion at 125 mL/h, 1 day ordered Recheck CK in a.m.  Falling episodes With altered mentation Differentials include secondary to ciprofloxacin versus infectious etiology including pneumonia, UTI Fall precaution, portable chest x-ray  UTI (urinary tract infection) Present on admission, being treated outpatient with ciprofloxacin which will not be continued inpatient UA ordered pending collection at this time Continue with ceftriaxone  2 g IV daily to complete a 5-day course in the hospital  Dementia without behavioral disturbance (HCC) Home donepezil  10 mg nightly, memantine  10 mg p.o. twice daily resumed on admission  Chart reviewed.   DVT prophylaxis: Enoxaparin  40 mg subcutaneous every 24 hours Code Status: DNR/DNI Diet: N.p.o. except for sips with meds pending nursing swallow screen Family  Communication: Updated son at bedside with patient's permission Disposition Plan:  Pending clinical course Consults called: PT, OT, TOC Admission status: Telemetry medical, observation  Past Medical History:  Diagnosis Date   GERD (gastroesophageal reflux disease)    Hypertension    Memory loss    Past Surgical History:  Procedure Laterality Date   CHOLECYSTECTOMY     COLONOSCOPY  2010   HERNIA REPAIR  2012   Social History:  reports that she has never smoked. She has never used smokeless tobacco. She reports that she does not drink alcohol and does not use drugs.  No Known Allergies Family History  Problem Relation Age of Onset   Tuberculosis Mother    Family history: Family history reviewed and not pertinent.  Prior to Admission medications   Medication Sig Start Date End Date Taking? Authorizing Provider  acetaminophen  (TYLENOL ) 500 MG tablet Take 500 mg by mouth 3 (three) times daily.    [provider]  aspirin  EC 81 MG tablet Take 81 mg by mouth daily.    [provider]  donepezil  (ARICEPT ) 10 MG tablet Take 10 mg by mouth at bedtime.    [provider]  esomeprazole (NEXIUM) 40 MG capsule Take 40 mg by mouth daily.     [provider]  hydrocortisone 2.5 % cream Apply 1 application topically 2 (two) times daily as needed (itching).    [provider]  lisinopril (PRINIVIL,ZESTRIL) 20 MG tablet Take 20 mg by mouth daily.  10/14/13   [provider]  Melatonin 3 MG TABS Take 3 mg by mouth at bedtime.    [provider]  memantine  (NAMENDA ) 10 MG tablet Take 10 mg by mouth 2 (two) times daily.     [provider]  ondansetron  (ZOFRAN  ODT) 4 MG disintegrating tablet Take 1 tablet (4 mg total) by mouth every 8 (eight) hours as needed for nausea or vomiting. 02/25/18   Edelmiro Leash, MD  vitamin B-12 (CYANOCOBALAMIN) 1000 MCG tablet Take 2,500 mcg by mouth daily.    [provider]   Physical Exam: Vitals:   11/29/23 1400 11/29/23 1430 11/29/23 1500 11/29/23 1600  BP: (!)  107/49 (!) 117/100 (!) 121/96   Pulse: 71 77 80 84  Resp: 14 16 19 15   Temp:      TempSrc:      SpO2: 98% 98% 100% 94%  Weight:   83.9 kg   Height:   5' 6 (1.676 m)    Constitutional: appears frail, age-appropriate, calm, pleasantly confused Eyes: PERRL, lids and conjunctivae normal ENMT: Mucous membranes are moist. Posterior pharynx clear of any exudate or lesions. Age-appropriate dentition.  Unable to assess hearing Neck: normal, supple, no masses, no thyromegaly Respiratory: clear to auscultation bilaterally, no wheezing, no crackles. Normal respiratory effort. No accessory muscle use.  Cardiovascular: Regular rate and rhythm, no murmurs / rubs / gallops. No extremity edema. 2+ pedal pulses. No carotid bruits.  Abdomen: no tenderness, no masses palpated, no hepatosplenomegaly. Bowel sounds positive.  Musculoskeletal: no clubbing / cyanosis. No joint deformity upper and lower extremities. Good ROM, no contractures, no atrophy. Normal muscle tone.  Skin: no rashes, lesions, ulcers. No induration Neurologic: Sensation intact. Strength 4/5 in all 4.  Psychiatric: Lacks judgment and insight consistent with dementia. Alert and oriented x 2 her first name only. Normal mood.   EKG: independently reviewed, showing sinus rhythm with rate of 73, QTc 460  Chest x-ray on Admission: I personally reviewed and I agree with  radiologist reading as below.  DG Chest Port 1 View Result Date: 11/29/2023 CLINICAL DATA:  Altered mental status EXAM: PORTABLE CHEST 1 VIEW COMPARISON:  02/25/2018 acute abdomen series FINDINGS: Numerous leads and wires project over the chest. Midline trachea. Normal heart size. Atherosclerosis in the transverse aorta. No pleural effusion or pneumothorax. Left-greater-than-right base volume loss and scarring/atelectasis, similar. No superimposed lobar consolidation. Cholecystectomy clips. IMPRESSION: 1. No acute cardiopulmonary disease. 2. Aortic Atherosclerosis (ICD10-I70.0).  Electronically Signed   By: Rockey Kilts M.D.   On: 11/29/2023 15:48   DG Shoulder Right Result Date: 11/29/2023 CLINICAL DATA:  Fall. EXAM: RIGHT SHOULDER - 2+ VIEW COMPARISON:  None Available. FINDINGS: No acute osseous or joint abnormality. Visualized right chest is unremarkable. IMPRESSION: No acute findings. Electronically Signed   By: Newell Eke M.D.   On: 11/29/2023 15:48   CT Cervical Spine Wo Contrast Result Date: 11/29/2023 CLINICAL DATA:  Neck trauma (Age >= 65y) Unwitnessed fall. EXAM: CT CERVICAL SPINE WITHOUT CONTRAST TECHNIQUE: Multidetector CT imaging of the cervical spine was performed without intravenous contrast. Multiplanar CT image reconstructions were also generated. RADIATION DOSE REDUCTION: This exam was performed according to the departmental dose-optimization program which includes automated exposure control, adjustment of the mA and/or kV according to patient size and/or use of iterative reconstruction technique. COMPARISON:  None Available. FINDINGS: Alignment: Normal. Skull base and vertebrae: No acute fracture. Vertebral body heights are maintained. The dens and skull base are intact. Degenerative pannus at C1-C2. Soft tissues and spinal canal: No prevertebral fluid or swelling. No visible canal hematoma. Disc levels: Mild for age diffuse degenerative disc disease and facet hypertrophy. Upper chest: No acute findings. Calcified granuloma in both lung apices. Patulous esophagus. Other: Soft tissue edema posterior to the right shoulder. IMPRESSION: 1. No acute fracture or subluxation of the cervical spine. 2. Soft tissue edema posterior to the right shoulder. Electronically Signed   By: Andrea Gasman M.D.   On: 11/29/2023 15:11   CT HEAD WO CONTRAST ( ) Result Date: 11/29/2023 CLINICAL DATA:  Unwitnessed fall. EXAM: CT HEAD WITHOUT CONTRAST TECHNIQUE: Contiguous axial images were obtained from the base of the skull through the vertex without intravenous contrast. RADIATION  DOSE REDUCTION: This exam was performed according to the departmental dose-optimization program which includes automated exposure control, adjustment of the mA and/or kV according to patient size and/or use of iterative reconstruction technique. COMPARISON:  Head CT 05/05/2015 FINDINGS: Brain: No intracranial hemorrhage, mass effect, or midline shift. Diffuse atrophy, temporal lobe predominant, with progression from 2017. No hydrocephalus. The basilar cisterns are patent. Periventricular deep white matter hypodensity typical of chronic small vessel ischemia. No evidence of territorial infarct or acute ischemia. No extra-axial or intracranial fluid collection. Vascular: Atherosclerosis of skullbase vasculature without hyperdense vessel or abnormal calcification. Skull: No fracture or focal lesion. Sinuses/Orbits: Paranasal sinuses and mastoid air cells are clear. The visualized orbits are unremarkable. Bilateral cataract resection Other: Posterior scalp hematoma. IMPRESSION: 1. Posterior scalp hematoma. No acute intracranial abnormality. No skull fracture. 2. Diffuse atrophy, temporal lobe predominant, with progression from 2017. Chronic small vessel ischemia. Electronically Signed   By: Andrea Gasman M.D.   On: 11/29/2023 15:07   DG Pelvis Portable Result Date: 11/29/2023 CLINICAL DATA:  Unwitnessed fall.  Two falls in the last 2 days. EXAM: PORTABLE PELVIS 1-2 VIEWS COMPARISON:  None Available. FINDINGS: No evidence of acute fracture. No hip fracture. Advanced right hip arthropathy with complete joint space loss, subchondral sclerosis and cystic change. Mild to moderate left  hip arthropathy with acetabular spurring and subchondral cysts. Pubic rami are intact. Degenerative change of the pubic symphysis and sacroiliac joints. Probable prior bladder tack. Vascular calcifications in the pelvis. IMPRESSION: 1. No acute fracture or dislocation. 2. Advanced right and mild to moderate left hip arthropathy.  Electronically Signed   By: Andrea Gasman M.D.   On: 11/29/2023 14:55   Labs on Admission: I have personally reviewed following labs  CBC: Recent Labs  Lab 11/29/23 1310  WBC 10.4  NEUTROABS 8.9*  HGB 11.3*  HCT 34.8*  MCV 92.1  PLT 267   Basic Metabolic Panel: Recent Labs  Lab 11/29/23 1310  NA 137  K 3.9  CL 103  CO2 26  GLUCOSE 122*  BUN 17  CREATININE 0.65  CALCIUM 8.7*   GFR: Estimated Creatinine Clearance: 48.9 mL/min (by C-G formula based on SCr of 0.65 mg/dL).  Liver Function Tests: Recent Labs  Lab 11/29/23 1310  AST 25  ALT 16  ALKPHOS 67  BILITOT 0.8  PROT 6.6  ALBUMIN 3.6   Cardiac Enzymes: Recent Labs  Lab 11/29/23 1310  CKTOTAL 335*   Urine analysis:    Component Value Date/Time   COLORURINE YELLOW (A) 11/29/2023 1521   APPEARANCEUR CLEAR (A) 11/29/2023 1521   APPEARANCEUR Clear 12/08/2013 0926   LABSPEC 1.014 11/29/2023 1521   LABSPEC 1.019 12/08/2013 0926   PHURINE 5.0 11/29/2023 1521   GLUCOSEU NEGATIVE 11/29/2023 1521   GLUCOSEU Negative 12/08/2013 0926   HGBUR NEGATIVE 11/29/2023 1521   BILIRUBINUR NEGATIVE 11/29/2023 1521   BILIRUBINUR Negative 12/08/2013 0926   KETONESUR 20 (A) 11/29/2023 1521   PROTEINUR NEGATIVE 11/29/2023 1521   NITRITE NEGATIVE 11/29/2023 1521   LEUKOCYTESUR NEGATIVE 11/29/2023 1521   LEUKOCYTESUR Negative 12/08/2013 0926   This document was prepared using Dragon Voice Recognition software and may include unintentional dictation errors.  Dr. Sherre [facility]  If 7PM-7AM, please contact overnight-coverage provider If 7AM-7PM, please contact day attending provider [email or web address]  11/29/2023, 4:53 PM

## 2023-11-29 NOTE — ED Notes (Signed)
 Arm board applied to left arm for IVF administration

## 2023-11-29 NOTE — Assessment & Plan Note (Signed)
 Home donepezil  10 mg nightly, memantine  10 mg p.o. twice daily resumed on admission

## 2023-11-29 NOTE — Assessment & Plan Note (Signed)
 Etiology workup in progress Differentials include secondary to ciprofloxacin versus infectious etiology including pneumonia, UTI Portable chest x-ray ordered on admission UA pending collection at this time Fall precaution, aspiration precaution

## 2023-11-29 NOTE — Assessment & Plan Note (Signed)
 Likely mild rhabdomyolysis in setting of falls and has been down on the floor since last evening Unwitnessed fall LR infusion at 125 mL/h, 1 day ordered Recheck CK in a.m.

## 2023-11-29 NOTE — ED Triage Notes (Signed)
 Pt BIB [facility] from [facility] due to a unwitnessed fall. Per EMS pt fell once last night and once today. Lump on back of head. Pt has dementia and states she remembers falling. Currently being treated for a UTI. No blood thinners.  124/60 73 hr 97.8 oral 136 cbg

## 2023-11-29 NOTE — ED Notes (Signed)
 Brief changed and bed alarm reactivated

## 2023-11-29 NOTE — ED Provider Notes (Addendum)
 [facility] Provider Note    Event Date/Time   First MD Initiated Contact with Patient 11/29/23 1249     (approximate)   History   Fall   HPI  Kathy Hernandez is a 89 y.o. female comes in with unwitnessed fall.  Patient had a fall once last night.  Patient has dementia does not remember it.  Currently on treatment for UTI.  Reportedly had another fall today.  Unable to get much history from patient.  Also called the facility but they did not pick up.  Physical Exam   Triage Vital Signs: ED Triage Vitals [11/29/23 1300]  Encounter Vitals Group     BP      Girls Systolic BP Percentile      Girls Diastolic BP Percentile      Boys Systolic BP Percentile      Boys Diastolic BP Percentile      Pulse      Resp      Temp      Temp src      SpO2      Weight 184 lb 14.4 oz (83.9 kg)     Height      Head Circumference      Peak Flow      Pain Score      Pain Loc      Pain Education      Exclude from Growth Chart     Most recent vital signs: Vitals:   11/29/23 1304 11/29/23 1400  BP: (!) 117/53 (!) 107/49  Pulse: 74 71  Resp: 15 14  Temp: 98.2 F (36.8 C)   SpO2: 100% 98%     General: Awake, no distress.  CV:  Good peripheral perfusion.  Resp:  Normal effort.  Abd:  No distention.  Other:  Patient is alert and oriented x 1.  She is moving all extremities.  She does not seem to have any tenderness in her extremities or her legs.  Abdomen seems soft and nontender.  She does have a hematoma to the back of her head   ED Results / Procedures / Treatments   Labs (all labs ordered are listed, but only abnormal results are displayed) Labs Reviewed  CBC WITH DIFFERENTIAL/PLATELET - Abnormal; Notable for the following components:      Result Value   RBC 3.78 (*)    Hemoglobin 11.3 (*)    HCT 34.8 (*)    Neutro Abs 8.9 (*)    All other components within normal limits  COMPREHENSIVE METABOLIC PANEL WITH GFR - Abnormal; Notable for the  following components:   Glucose, Bld 122 (*)    Calcium 8.7 (*)    All other components within normal limits  CK - Abnormal; Notable for the following components:   Total CK 335 (*)    All other components within normal limits  TROPONIN I (HIGH SENSITIVITY)  TROPONIN I (HIGH SENSITIVITY)     EKG  My interpretation of EKG:  Sinus rate of 73 without any ST elevation or T wave inversions, normal intervals  RADIOLOGY I have reviewed the  ct personally and interpreted no evidence of intracranial hemorrhage  PROCEDURES:  Critical Care performed: No  Procedures   MEDICATIONS ORDERED IN ED: Medications - No data to display   IMPRESSION / MDM / ASSESSMENT AND PLAN / ED COURSE  I reviewed the triage vital signs and the nursing notes.   Patient's presentation is most consistent with acute presentation with  potential threat to life or bodily function.   Patient with dementia currently on treatment for UTI comes in with concerns for a falls.  CT imaging ordered evaluate for intercranial hemorrhage, cervical fracture.  She does not seem to have any pain in her hips.  Will get x-ray for screening.  Troponin is negative and does not sound like ACS.  CBC shows slightly low hemoglobin at 11.3.  CMP reassuring CK slightly elevated  I reviewed the telemedicine note from 8/6 where patient was started on Cipro 2 times daily for 5 days.  Patient's son he does report that she has had increasing confusion and this typically happens when she gets a UTI.  She does not typically fall but but she uses a walker.  This is abnormal for her to have these types of falls  Given the above the falls could be related to UTI.  I have attempted to call [facility] to try to figure out patient's urine culture for sensitivities and but in the meantime we will give a dose of ceftriaxone .  Will try to repeat urine here to help get a culture if unable to get a hold of them.  Will discuss hospital team for  admission due to new falls, new confusion.  1. No acute fracture or subluxation of the cervical spine. 2. Soft tissue edema posterior to the right shoulder.  IMPRESSION: 1. Posterior scalp hematoma. No acute intracranial abnormality. No skull fracture. 2. Diffuse atrophy, temporal lobe predominant, with progression from 2017. Chronic small vessel ischemia.  IMPRESSION: 1. No acute fracture or dislocation. 2. Advanced right and mild to moderate left hip arthropathy.   Per [facility]- they will look and see if they can find culture report but not sure if they have it either.    Per [facility] they do not have copy of urine culture.   The patient is on the cardiac monitor to evaluate for evidence of arrhythmia and/or significant heart rate changes.      FINAL CLINICAL IMPRESSION(S) / ED DIAGNOSES   Final diagnoses:  Fall, initial encounter  Altered mental status, unspecified altered mental status type  Urinary tract infection without hematuria, site unspecified  Adverse effect of drug, initial encounter     Rx / DC Orders   ED Discharge Orders     None        Note:  This document was prepared using Dragon voice recognition software and may include unintentional dictation errors.   Ernest Ronal BRAVO, MD 11/29/23 1514    Ernest Ronal BRAVO, MD 11/29/23 1517    Ernest Ronal BRAVO, MD 11/29/23 [phone]

## 2023-11-29 NOTE — ED Notes (Signed)
 Pt changed into hospital gown and brief applied. Mepilex applied to sacrum- blanchable redness noted on sacrum, skin is intact. Warm blanket given. Bed alarm turned on. Pt is alert, disoriented x4, follows commands. Respirations even and unlabored.

## 2023-11-29 NOTE — Assessment & Plan Note (Addendum)
 Present on admission, being treated outpatient with ciprofloxacin which will not be continued inpatient UA ordered pending collection at this time Continue with ceftriaxone  2 g IV daily to complete a 5-day course in the hospital

## 2023-11-29 NOTE — ED Notes (Signed)
Pt brief changed of urine

## 2023-11-30 ENCOUNTER — Observation Stay

## 2023-11-30 DIAGNOSIS — N390 Urinary tract infection, site not specified: Secondary | ICD-10-CM | POA: Diagnosis not present

## 2023-11-30 DIAGNOSIS — R471 Dysarthria and anarthria: Secondary | ICD-10-CM | POA: Diagnosis present

## 2023-11-30 DIAGNOSIS — R339 Retention of urine, unspecified: Secondary | ICD-10-CM | POA: Diagnosis present

## 2023-11-30 DIAGNOSIS — R5381 Other malaise: Secondary | ICD-10-CM | POA: Diagnosis present

## 2023-11-30 DIAGNOSIS — R296 Repeated falls: Secondary | ICD-10-CM | POA: Diagnosis present

## 2023-11-30 DIAGNOSIS — Z7982 Long term (current) use of aspirin: Secondary | ICD-10-CM | POA: Diagnosis not present

## 2023-11-30 DIAGNOSIS — W19XXXA Unspecified fall, initial encounter: Secondary | ICD-10-CM | POA: Diagnosis present

## 2023-11-30 DIAGNOSIS — S0003XA Contusion of scalp, initial encounter: Secondary | ICD-10-CM | POA: Diagnosis present

## 2023-11-30 DIAGNOSIS — G4700 Insomnia, unspecified: Secondary | ICD-10-CM | POA: Diagnosis present

## 2023-11-30 DIAGNOSIS — R4182 Altered mental status, unspecified: Secondary | ICD-10-CM | POA: Diagnosis present

## 2023-11-30 DIAGNOSIS — M6282 Rhabdomyolysis: Secondary | ICD-10-CM | POA: Diagnosis present

## 2023-11-30 DIAGNOSIS — R609 Edema, unspecified: Secondary | ICD-10-CM | POA: Diagnosis present

## 2023-11-30 DIAGNOSIS — R404 Transient alteration of awareness: Secondary | ICD-10-CM | POA: Diagnosis not present

## 2023-11-30 DIAGNOSIS — K219 Gastro-esophageal reflux disease without esophagitis: Secondary | ICD-10-CM | POA: Diagnosis present

## 2023-11-30 DIAGNOSIS — Z831 Family history of other infectious and parasitic diseases: Secondary | ICD-10-CM | POA: Diagnosis not present

## 2023-11-30 DIAGNOSIS — F0390 Unspecified dementia without behavioral disturbance: Secondary | ICD-10-CM | POA: Diagnosis present

## 2023-11-30 DIAGNOSIS — Z515 Encounter for palliative care: Secondary | ICD-10-CM | POA: Diagnosis not present

## 2023-11-30 DIAGNOSIS — R627 Adult failure to thrive: Secondary | ICD-10-CM | POA: Diagnosis present

## 2023-11-30 DIAGNOSIS — I700 Atherosclerosis of aorta: Secondary | ICD-10-CM | POA: Diagnosis present

## 2023-11-30 DIAGNOSIS — E876 Hypokalemia: Secondary | ICD-10-CM | POA: Diagnosis not present

## 2023-11-30 DIAGNOSIS — Z79899 Other long term (current) drug therapy: Secondary | ICD-10-CM | POA: Diagnosis not present

## 2023-11-30 DIAGNOSIS — I10 Essential (primary) hypertension: Secondary | ICD-10-CM | POA: Diagnosis present

## 2023-11-30 DIAGNOSIS — Z66 Do not resuscitate: Secondary | ICD-10-CM | POA: Diagnosis present

## 2023-11-30 LAB — CBC
HCT: 31.1 % — ABNORMAL LOW (ref 36.0–46.0)
Hemoglobin: 10 g/dL — ABNORMAL LOW (ref 12.0–15.0)
MCH: 29.7 pg (ref 26.0–34.0)
MCHC: 32.2 g/dL (ref 30.0–36.0)
MCV: 92.3 fL (ref 80.0–100.0)
Platelets: 241 K/uL (ref 150–400)
RBC: 3.37 MIL/uL — ABNORMAL LOW (ref 3.87–5.11)
RDW: 13.2 % (ref 11.5–15.5)
WBC: 7.6 K/uL (ref 4.0–10.5)
nRBC: 0 % (ref 0.0–0.2)

## 2023-11-30 LAB — BASIC METABOLIC PANEL WITH GFR
Anion gap: 8 (ref 5–15)
BUN: 13 mg/dL (ref 8–23)
CO2: 27 mmol/L (ref 22–32)
Calcium: 8.2 mg/dL — ABNORMAL LOW (ref 8.9–10.3)
Chloride: 104 mmol/L (ref 98–111)
Creatinine, Ser: 0.52 mg/dL (ref 0.44–1.00)
GFR, Estimated: >60 mL/min (ref >60)
Glucose, Bld: 94 mg/dL (ref 70–99)
Potassium: 3.3 mmol/L — ABNORMAL LOW (ref 3.5–5.1)
Sodium: 139 mmol/L (ref 135–145)

## 2023-11-30 LAB — URINE CULTURE: Culture: NO GROWTH

## 2023-11-30 LAB — PROCALCITONIN: Procalcitonin: <0.1 ng/mL

## 2023-11-30 LAB — CK: Total CK: 380 U/L — ABNORMAL HIGH (ref 38–234)

## 2023-11-30 MED ORDER — SODIUM CHLORIDE 0.9 % IV SOLN
2.0000 g | Freq: Once | INTRAVENOUS | Status: AC
  Administered 2023-11-30: 2 g via INTRAVENOUS
  Filled 2023-11-30: qty 20

## 2023-11-30 MED ORDER — POTASSIUM CHLORIDE CRYS ER 20 MEQ PO TBCR
40.0000 meq | EXTENDED_RELEASE_TABLET | Freq: Once | ORAL | Status: DC
  Filled 2023-11-30: qty 2

## 2023-11-30 MED ORDER — POTASSIUM CHLORIDE 20 MEQ PO PACK
40.0000 meq | PACK | Freq: Once | ORAL | Status: AC
  Administered 2023-11-30: 40 meq via ORAL
  Filled 2023-11-30: qty 2

## 2023-11-30 NOTE — Care Management Obs Status (Signed)
 MEDICARE OBSERVATION STATUS NOTIFICATION   Patient Details  Name: Kathy Hernandez MRN: [id number] Date of Birth: 01/17/1930   Medicare Observation Status Notification Given:  Yes    Wendell Nicoson W, CMA 11/30/2023, 11:21 AM

## 2023-11-30 NOTE — Progress Notes (Signed)
 Patient back from MRI.

## 2023-11-30 NOTE — Progress Notes (Signed)
 SLP Cancellation Note  Patient Details Name: Armenta Erskin MRN: [id number] DOB: Oct 27, 1929   Cancelled treatment:       Reason Eval/Treat Not Completed:  (chart reviewed)  Patient admitted to the ED from [facility] with Fall. SHe has Baseline Dementia, currently on treatment for UTI.  Pt has been admitted less than 24 hours. ST services will f/u tomorrow if any cognitive-linguistic assessment needs post Time for medical stability. Pt is currently on a dysphagia level 2 w/ thins; general aspiration precautions including Pills in Puree.     Comer Portugal, MS, CCC-SLP Speech Language Pathologist Rehab Services; [facility] [phone] (ascom) Alim Cattell 11/30/2023, 11:26 AM

## 2023-11-30 NOTE — Evaluation (Signed)
 Physical Therapy Evaluation Patient Details Name: Kathy Hernandez MRN: [id number] DOB: 1929-04-29 Today's Date: 11/30/2023  History of Present Illness  Pt is a 89 y/o F admitted on 11/29/23 after presenting from [facility] with chief concerns of AMS, increasing falls. Pt currently being treated for UTI, work-up in progress with MRI pending. PMH: dementia, B hip arthroplasty, GERD, insomnia  Clinical Impression  Pt seen for PT evaluation with co-tx with OT. Pt's son Vilinda) present for session, reporting pt is from ALF, ambulates to/from dining room with RW without assistance but does require assistance for bathing. On this date, pt is oriented to self only, poor short term memory, limited by hearing impairment, poor ability to follow simple commands with extra time & multimodal cuing. Pt requires +2 assist for transfer to/from [facility] with RW. Pt would benefit from ongoing skilled PT treatment to progress mobility to decrease fall risk & caregiver burden.        If plan is discharge home, recommend the following: Two people to help with walking and/or transfers;Two people to help with bathing/dressing/bathroom   Can travel by private vehicle   No    Equipment Recommendations Other (comment) (defer to next venue)  Recommendations for Other Services       Functional Status Assessment Patient has had a recent decline in their functional status and demonstrates the ability to make significant improvements in function in a reasonable and predictable amount of time.     Precautions / Restrictions Precautions Precautions: Fall Restrictions Weight Bearing Restrictions Per Provider Order: No      Mobility  Bed Mobility Overal bed mobility: Needs Assistance Bed Mobility: Supine to Sit, Sit to Supine     Supine to sit: Max assist, HOB elevated, Used rails Sit to supine: Mod assist (assistance to elevate BLE onto bed)   General bed mobility comments: exit L side of bed, poor initiation &  participation in movement, easily distracted    Transfers Overall transfer level: Needs assistance Equipment used: Rolling walker (2 wheels) Transfers: Sit to/from Stand, Bed to chair/wheelchair/BSC Sit to Stand: Max assist, +2 physical assistance, +2 safety/equipment   Step pivot transfers: Max assist, +2 physical assistance, +2 safety/equipment, From elevated surface       General transfer comment: sit>stand from EOB, BSC with cuing to initiate, assistance to power up, transfers to/from [facility] with RW, assistance for RW management    Ambulation/Gait                  Stairs            Wheelchair Mobility     Tilt Bed    Modified Rankin (Stroke Patients Only)       Balance Overall balance assessment: Needs assistance Sitting-balance support: Feet supported Sitting balance-Leahy Scale: Fair Sitting balance - Comments: supervision static sitting   Standing balance support: Bilateral upper extremity supported, Reliant on assistive device for balance, During functional activity Standing balance-Leahy Scale: Poor                               Pertinent Vitals/Pain Pain Assessment Pain Assessment: Faces Faces Pain Scale: Hurts a little bit Pain Location: generalized at times Pain Descriptors / Indicators: Discomfort Pain Intervention(s): Monitored during session, Repositioned    Home Living Family/patient expects to be discharged to:: Assisted living                 Home Equipment: Agricultural consultant (  2 wheels) Additional Comments: Pt resides at [facility]F.    Prior Function               Mobility Comments: Per son, pt was ambulatory to/from dining room 1-2x/day with walker, goes to bathroom without assistance. ADLs Comments: Goes to bathroom without assistance, staff assists with bathing.     Extremity/Trunk Assessment   Upper Extremity Assessment Upper Extremity Assessment: Generalized weakness    Lower Extremity  Assessment Lower Extremity Assessment: Generalized weakness       Communication   Communication Communication: Impaired Factors Affecting Communication: Hearing impaired    Cognition Arousal: Alert Behavior During Therapy: Anxious   PT - Cognitive impairments: History of cognitive impairments                       PT - Cognition Comments: Pt with hx of dementia, perseverative on where are we? why am I here?, reports she believes she's in [facility], PT/OT attempt to redirect pt throughout Following commands: Impaired Following commands impaired: Follows one step commands with increased time, Follows one step commands inconsistently     Cueing Cueing Techniques: Verbal cues, Gestural cues, Tactile cues     General Comments General comments (skin integrity, edema, etc.): incontinent void, pt unaware of bed already soiled 2/2 urinary incontinence.    Exercises     Assessment/Plan    PT Assessment Patient needs continued PT services  PT Problem List Decreased strength;Decreased range of motion;Decreased cognition;Pain;Decreased activity tolerance;Decreased balance;Decreased knowledge of use of DME;Decreased safety awareness;Decreased mobility       PT Treatment Interventions DME instruction;Balance training;Gait training;Neuromuscular re-education;Functional mobility training;Patient/family education;Therapeutic activities;Therapeutic exercise    PT Goals (Current goals can be found in the Care Plan section)  Acute Rehab PT Goals Patient Stated Goal: get better PT Goal Formulation: With family Time For Goal Achievement: 12/14/23 Potential to Achieve Goals: Fair    Frequency Min 2X/week     Co-evaluation PT/OT/SLP Co-Evaluation/Treatment: Yes Reason for Co-Treatment: Necessary to address cognition/behavior during functional activity;For patient/therapist safety PT goals addressed during session: Mobility/safety with mobility;Balance;Proper use of DME          AM-PAC PT 6 Clicks Mobility  Outcome Measure Help needed turning from your back to your side while in a flat bed without using bedrails?: A Lot Help needed moving from lying on your back to sitting on the side of a flat bed without using bedrails?: Total Help needed moving to and from a bed to a chair (including a wheelchair)?: Total Help needed standing up from a chair using your arms (e.g., wheelchair or bedside chair)?: Total Help needed to walk in hospital room?: Total Help needed climbing 3-5 steps with a railing? : Total 6 Click Score: 7    End of Session   Activity Tolerance: Patient tolerated treatment well Patient left: in bed (in handoff to transport tech (arrived to take pt to MRI)) Nurse Communication: Mobility status PT Visit Diagnosis: Unsteadiness on feet (R26.81);Other abnormalities of gait and mobility (R26.89);Difficulty in walking, not elsewhere classified (R26.2);Muscle weakness (generalized) (M62.81);History of falling (Z91.81)    Time: 1203-1220 PT Time Calculation (min) (ACUTE ONLY): 17 min   Charges:   PT Evaluation $PT Eval Moderate Complexity: 1 Mod   PT General Charges $$ ACUTE PT VISIT: 1 Visit         Richerd Pinal, PT, DPT 11/30/23, 12:41 PM   Richerd CHRISTELLA Pinal 11/30/2023, 12:39 PM

## 2023-11-30 NOTE — Evaluation (Signed)
 Occupational Therapy Evaluation Patient Details Name: Kathy Hernandez MRN: [id number] DOB: [birth year] Today's Date: 11/30/2023   History of Present Illness   Pt is a 89 y/o F admitted on 11/29/23 after presenting from [facility] with chief concerns of AMS, increasing falls. Pt currently being treated for UTI, work-up in progress with MRI pending. PMH: dementia, B hip arthroplasty, GERD, insomnia    Clinical Impressions Kathy Hernandez was seen for OT evaluation this date. Prior to hospital admission, pt was MOD I using RW, assist for bathing. Pt lives at [facility]. Pt currently requires MAX A x2 + RW for BSC t/f and pericare. MAX A don/doff socks and MOD A don/doff gown in sitting. Pt requires constant reorientation to location and situation. Pt would benefit from skilled OT to address noted impairments and functional limitations (see below for any additional details). Upon hospital discharge, recommend OT follow up <3 hours/day.    If plan is discharge home, recommend the following:   Two people to help with walking and/or transfers;Two people to help with bathing/dressing/bathroom     Functional Status Assessment   Patient has had a recent decline in their functional status and demonstrates the ability to make significant improvements in function in a reasonable and predictable amount of time.     Equipment Recommendations   Other (comment) (defer)     Recommendations for Other Services         Precautions/Restrictions   Precautions Precautions: Fall Recall of Precautions/Restrictions: Impaired Restrictions Weight Bearing Restrictions Per Provider Order: No     Mobility Bed Mobility Overal bed mobility: Needs Assistance Bed Mobility: Supine to Sit, Sit to Supine     Supine to sit: Max assist, HOB elevated, Used rails Sit to supine: Mod assist        Transfers Overall transfer level: Needs assistance Equipment used: Rolling walker (2 wheels) Transfers:  Sit to/from Stand, Bed to chair/wheelchair/BSC Sit to Stand: Max assist, +2 physical assistance, +2 safety/equipment     Step pivot transfers: Max assist, +2 physical assistance, +2 safety/equipment, From elevated surface            Balance Overall balance assessment: Needs assistance Sitting-balance support: Feet supported Sitting balance-Leahy Scale: Fair     Standing balance support: Bilateral upper extremity supported, Reliant on assistive device for balance, During functional activity Standing balance-Leahy Scale: Poor                             ADL either performed or assessed with clinical judgement   ADL Overall ADL's : Needs assistance/impaired                                       General ADL Comments: MAX A x2 + RW for BSC t/f and pericare. MAX A don/doff socks and MOD A don/doff gown in sitting     Vision         Perception         Praxis         Pertinent Vitals/Pain Pain Assessment Pain Assessment: Faces Faces Pain Scale: Hurts a little bit Pain Location: generalized Pain Descriptors / Indicators: Discomfort Pain Intervention(s): Limited activity within patient's tolerance, Repositioned     Extremity/Trunk Assessment Upper Extremity Assessment Upper Extremity Assessment: Generalized weakness   Lower Extremity Assessment Lower Extremity Assessment: Generalized weakness  Communication Communication Communication: Impaired Factors Affecting Communication: Hearing impaired   Cognition Arousal: Alert Behavior During Therapy: Anxious Cognition: History of cognitive impairments, Cognition impaired   Orientation impairments: Place, Time, Situation         OT - Cognition Comments: son reports baseline A&Ox4 with gradual decline noted. During session pt asking where are we are we in [facility] repeatedly ~15 times despite reorientation attempts                 Following commands: Impaired Following  commands impaired: Follows one step commands inconsistently     Cueing  General Comments   Cueing Techniques: Verbal cues;Gestural cues;Tactile cues  incontinent void, pt unaware of bed already soiled 2/2 urinary incontinence.   Exercises     Shoulder Instructions      Home Living Family/patient expects to be discharged to:: Assisted living                             Home Equipment: Rolling Walker (2 wheels)   Additional Comments: Pt resides at [facility].      Prior Functioning/Environment Prior Level of Function : Needs assist             Mobility Comments: Per son, pt was ambulatory to/from dining room 1-2x/day with walker, goes to bathroom without assistance. ADLs Comments: Goes to bathroom without assistance, staff assists with bathing.    OT Problem List: Decreased strength;Decreased range of motion;Decreased activity tolerance;Impaired balance (sitting and/or standing);Decreased safety awareness   OT Treatment/Interventions: Therapeutic exercise;Self-care/ADL training;Energy conservation;DME and/or AE instruction;Therapeutic activities      OT Goals(Current goals can be found in the care plan section)   Acute Rehab OT Goals Patient Stated Goal: to return to PLOF OT Goal Formulation: With patient/family Time For Goal Achievement: 12/14/23 Potential to Achieve Goals: Fair ADL Goals Pt Will Perform Grooming: with set-up;with supervision;sitting Pt Will Perform Lower Body Dressing: with min assist;with caregiver independent in assisting;sit to/from stand Pt Will Transfer to Toilet: with min assist;ambulating;bedside commode   OT Frequency:  Min 2X/week    Co-evaluation PT/OT/SLP Co-Evaluation/Treatment: Yes Reason for Co-Treatment: Necessary to address cognition/behavior during functional activity;For patient/therapist safety PT goals addressed during session: Mobility/safety with mobility;Balance;Proper use of DME OT goals addressed  during session: ADL's and self-care      AM-PAC OT 6 Clicks Daily Activity     Outcome Measure Help from another person eating meals?: None Help from another person taking care of personal grooming?: A Little Help from another person toileting, which includes using toliet, bedpan, or urinal?: A Lot Help from another person bathing (including washing, rinsing, drying)?: A Lot Help from another person to put on and taking off regular upper body clothing?: A Lot Help from another person to put on and taking off regular lower body clothing?: A Lot 6 Click Score: 15   End of Session Equipment Utilized During Treatment: Rolling walker (2 wheels)  Activity Tolerance: Patient tolerated treatment well Patient left: in bed;with bed alarm set;with call bell/phone within reach;with family/visitor present  OT Visit Diagnosis: Other abnormalities of gait and mobility (R26.89);Muscle weakness (generalized) (M62.81)                Time: 8797-8779 OT Time Calculation (min): 18 min Charges:  OT General Charges $OT Visit: 1 Visit OT Evaluation $OT Eval Moderate Complexity: 1 Mod  Elston Slot, M.S. OTR/L  11/30/23, 1:47 PM  ascom [phone]

## 2023-11-30 NOTE — Progress Notes (Signed)
 PROGRESS NOTE    Kathy Hernandez  FMW:[id number] DOB: [birth year] DOA: 11/29/2023 PCP: Jyl Railing, MD    Brief Narrative:   89 year old female with history of dementia, bilateral hip arthropathy right greater than left, GERD, insomnia, who comes to the ED via EMS from [facility] for chief concerns of altered mentation, increasing falls.   At bedside, patient murmurs incoherently.  She was able to tell me her first name.  She was not able to tell me her age, current calendar year, current location.   Son reports that patient has been more confused for the last 3 to 4 days.  He reports that the facility did not report nausea, vomiting, diarrhea, fever.   Assessment & Plan:   Principal Problem:   Altered mental status Active Problems:   Dementia without behavioral disturbance (HCC)   UTI (urinary tract infection)   Falling episodes   Elevated CK   Aortic atherosclerosis (HCC)  Altered mental status Etiology workup in progress Still on differential that this is infectious encephalopathy however repeat urinalysis is inconsistent with infection, negative procalcitonin, no white count, no fever Progression of underlying cognitive decline is also on differential as is subclinical stroke Plan: Empiric antibiotics Check MRI brain without contrast Delirium precautions Fall and aspiration precautions  Elevated CK Likely mild rhabdomyolysis in setting of falls and has been down on the floor since last evening Unwitnessed fall Improving.  On IV fluids   Falling episodes With altered mentation Possibly progression of underlying cognitive decline.  Difficult to exclude infectious encephalopathy.  Continue with pulm precautions.  Therapy evaluations when appropriate.   UTI (urinary tract infection) Working diagnosis.  Present on admission.  Current urine indices are not indicative of acute infection however this could be impacted by previous exposure to antibiotics.  Continue  with IV antibiotics as above   Dementia without behavioral disturbance (HCC) Hold donepezil  and memantine    DVT prophylaxis: Subcu Lovenox  Code Status: DNR/DNI Family Communication: Son at bedside 8/10 Disposition Plan: Status is: Observation The patient will require care spanning > 2 midnights and should be moved to inpatient because: Encephalopathy of unclear etiology   Level of care: Telemetry Medical  Consultants:  None  Procedures:  None  Antimicrobials: Ceftriaxone    Subjective: Seen and examined.  Son at bedside.  Patient mumbling but is able to tell me her name.  Unable to verbalize any further complaints.  Objective: Vitals:   11/29/23 1900 11/29/23 2032 11/30/23 0333 11/30/23 0838  BP: (!) 123/55 (!) 122/51 (!) 118/51 (!) 119/44  Pulse: 94 87 71 66  Resp: 16 14 13 14   Temp:  98.7 F (37.1 C) 98.2 F (36.8 C)   TempSrc:      SpO2: 96% 94% 90% (!) 89%  Weight:      Height:        Intake/Output Summary (Last 24 hours) at 11/30/2023 1208 Last data filed at 11/30/2023 1100 Gross per 24 hour  Intake 0 ml  Output --  Net 0 ml   Filed Weights   11/29/23 1300 11/29/23 1500  Weight: 83.9 kg 83.9 kg    Examination:  General exam: Frail appearing Respiratory system: Lungs clear.  Normal work of breathing Cardiovascular system: 1 S2, RRR, no murmur, no pedal edema Gastrointestinal system: soft, NT/ND, normal bowel sounds Central nervous system: Alert.  Dysarthric speech.  Oriented x 1. Extremities: Symmetric 5 x 5 power. Skin: No rashes, lesions or ulcers Psychiatry: Judgement and insight appear impaired. Mood & affect flattened.  Data Reviewed: I have personally reviewed following labs and imaging studies  CBC: Recent Labs  Lab 11/29/23 1310 11/30/23 0455  WBC 10.4 7.6  NEUTROABS 8.9*  --   HGB 11.3* 10.0*  HCT 34.8* 31.1*  MCV 92.1 92.3  PLT 267 241   Basic Metabolic Panel: Recent Labs  Lab 11/29/23 1310 11/30/23 0455  NA 137 139   K 3.9 3.3*  CL 103 104  CO2 26 27  GLUCOSE 122* 94  BUN 17 13  CREATININE 0.65 0.52  CALCIUM 8.7* 8.2*   GFR: Estimated Creatinine Clearance: 48.9 mL/min (by C-G formula based on SCr of 0.52 mg/dL). Liver Function Tests: Recent Labs  Lab 11/29/23 1310  AST 25  ALT 16  ALKPHOS 67  BILITOT 0.8  PROT 6.6  ALBUMIN 3.6   No results for input(s): LIPASE, AMYLASE in the last 168 hours. No results for input(s): AMMONIA in the last 168 hours. Coagulation Profile: No results for input(s): INR, PROTIME in the last 168 hours. Cardiac Enzymes: Recent Labs  Lab 11/29/23 1310 11/30/23 0455  CKTOTAL 335* 380*   BNP (last 3 results) No results for input(s): PROBNP in the last 8760 hours. HbA1C: No results for input(s): HGBA1C in the last 72 hours. CBG: No results for input(s): GLUCAP in the last 168 hours. Lipid Profile: No results for input(s): CHOL, HDL, LDLCALC, TRIG, CHOLHDL, LDLDIRECT in the last 72 hours. Thyroid  Function Tests: No results for input(s): TSH, T4TOTAL, FREET4, T3FREE, THYROIDAB in the last 72 hours. Anemia Panel: No results for input(s): VITAMINB12, FOLATE, FERRITIN, TIBC, IRON, RETICCTPCT in the last 72 hours. Sepsis Labs: Recent Labs  Lab 11/30/23 0454  PROCALCITON <0.10    No results found for this or any previous visit (from the past 240 hours).       Radiology Studies: DG Chest Port 1 View Result Date: 11/29/2023 CLINICAL DATA:  Altered mental status EXAM: PORTABLE CHEST 1 VIEW COMPARISON:  02/25/2018 acute abdomen series FINDINGS: Numerous leads and wires project over the chest. Midline trachea. Normal heart size. Atherosclerosis in the transverse aorta. No pleural effusion or pneumothorax. Left-greater-than-right base volume loss and scarring/atelectasis, similar. No superimposed lobar consolidation. Cholecystectomy clips. IMPRESSION: 1. No acute cardiopulmonary disease. 2. Aortic Atherosclerosis  (ICD10-I70.0). Electronically Signed   By: Rockey Kilts M.D.   On: 11/29/2023 15:48   DG Shoulder Right Result Date: 11/29/2023 CLINICAL DATA:  Fall. EXAM: RIGHT SHOULDER - 2+ VIEW COMPARISON:  None Available. FINDINGS: No acute osseous or joint abnormality. Visualized right chest is unremarkable. IMPRESSION: No acute findings. Electronically Signed   By: Newell Eke M.D.   On: 11/29/2023 15:48   CT Cervical Spine Wo Contrast Result Date: 11/29/2023 CLINICAL DATA:  Neck trauma (Age >= 65y) Unwitnessed fall. EXAM: CT CERVICAL SPINE WITHOUT CONTRAST TECHNIQUE: Multidetector CT imaging of the cervical spine was performed without intravenous contrast. Multiplanar CT image reconstructions were also generated. RADIATION DOSE REDUCTION: This exam was performed according to the departmental dose-optimization program which includes automated exposure control, adjustment of the mA and/or kV according to patient size and/or use of iterative reconstruction technique. COMPARISON:  None Available. FINDINGS: Alignment: Normal. Skull base and vertebrae: No acute fracture. Vertebral body heights are maintained. The dens and skull base are intact. Degenerative pannus at C1-C2. Soft tissues and spinal canal: No prevertebral fluid or swelling. No visible canal hematoma. Disc levels: Mild for age diffuse degenerative disc disease and facet hypertrophy. Upper chest: No acute findings. Calcified granuloma in both lung apices. Patulous esophagus.  Other: Soft tissue edema posterior to the right shoulder. IMPRESSION: 1. No acute fracture or subluxation of the cervical spine. 2. Soft tissue edema posterior to the right shoulder. Electronically Signed   By: Andrea Gasman M.D.   On: 11/29/2023 15:11   CT HEAD WO CONTRAST ( ) Result Date: 11/29/2023 CLINICAL DATA:  Unwitnessed fall. EXAM: CT HEAD WITHOUT CONTRAST TECHNIQUE: Contiguous axial images were obtained from the base of the skull through the vertex without intravenous  contrast. RADIATION DOSE REDUCTION: This exam was performed according to the departmental dose-optimization program which includes automated exposure control, adjustment of the mA and/or kV according to patient size and/or use of iterative reconstruction technique. COMPARISON:  Head CT 05/05/2015 FINDINGS: Brain: No intracranial hemorrhage, mass effect, or midline shift. Diffuse atrophy, temporal lobe predominant, with progression from 2017. No hydrocephalus. The basilar cisterns are patent. Periventricular deep white matter hypodensity typical of chronic small vessel ischemia. No evidence of territorial infarct or acute ischemia. No extra-axial or intracranial fluid collection. Vascular: Atherosclerosis of skullbase vasculature without hyperdense vessel or abnormal calcification. Skull: No fracture or focal lesion. Sinuses/Orbits: Paranasal sinuses and mastoid air cells are clear. The visualized orbits are unremarkable. Bilateral cataract resection Other: Posterior scalp hematoma. IMPRESSION: 1. Posterior scalp hematoma. No acute intracranial abnormality. No skull fracture. 2. Diffuse atrophy, temporal lobe predominant, with progression from 2017. Chronic small vessel ischemia. Electronically Signed   By: Andrea Gasman M.D.   On: 11/29/2023 15:07   DG Pelvis Portable Result Date: 11/29/2023 CLINICAL DATA:  Unwitnessed fall.  Two falls in the last 2 days. EXAM: PORTABLE PELVIS 1-2 VIEWS COMPARISON:  None Available. FINDINGS: No evidence of acute fracture. No hip fracture. Advanced right hip arthropathy with complete joint space loss, subchondral sclerosis and cystic change. Mild to moderate left hip arthropathy with acetabular spurring and subchondral cysts. Pubic rami are intact. Degenerative change of the pubic symphysis and sacroiliac joints. Probable prior bladder tack. Vascular calcifications in the pelvis. IMPRESSION: 1. No acute fracture or dislocation. 2. Advanced right and mild to moderate left hip  arthropathy. Electronically Signed   By: Andrea Gasman M.D.   On: 11/29/2023 14:55        Scheduled Meds:  donepezil   10 mg Oral QHS   enoxaparin  (LOVENOX ) injection  40 mg Subcutaneous Q24H   melatonin  5 mg Oral QHS   memantine   10 mg Oral BID   potassium chloride   40 mEq Oral Once   Continuous Infusions:  cefTRIAXone  (ROCEPHIN )  IV     lactated ringers  125 mL/hr at 11/29/23 2105     LOS: 0 days     Calvin KATHEE Robson, MD [facility]   If 7PM-7AM, please contact night-coverage  11/30/2023, 12:08 PM

## 2023-11-30 NOTE — Progress Notes (Signed)
 Patient off of the floor for MRI.

## 2023-12-01 DIAGNOSIS — R296 Repeated falls: Secondary | ICD-10-CM

## 2023-12-01 DIAGNOSIS — F0390 Unspecified dementia without behavioral disturbance: Secondary | ICD-10-CM

## 2023-12-01 DIAGNOSIS — R404 Transient alteration of awareness: Secondary | ICD-10-CM | POA: Diagnosis not present

## 2023-12-01 DIAGNOSIS — R4182 Altered mental status, unspecified: Secondary | ICD-10-CM

## 2023-12-01 DIAGNOSIS — Z515 Encounter for palliative care: Secondary | ICD-10-CM

## 2023-12-01 DIAGNOSIS — N390 Urinary tract infection, site not specified: Secondary | ICD-10-CM | POA: Diagnosis not present

## 2023-12-01 NOTE — TOC Initial Note (Addendum)
 Transition of Care [facility]) - Initial/Assessment Note    Patient Details  Name: Kathy Hernandez MRN: [id number] Date of Birth: 1930/02/08  [facility] CM/SW Contact:    Kathy ONEIDA Haddock, RN Phone Number: 12/01/2023, 1:52 PM  Clinical Narrative:                  Patient admitted from [facility] Per [name] at [facility] patient is 1 person assist to get out of bed, and ambulates independently with RW to the dining room.  Therapy recommending   Per MD he will he having goals of care discussion with patients son today     315 pm. Met with son at bedside.  He express interest in patient returning to [facility] with hospice services.  VM left for [place] at [facility] to discuss.  Son is aware that if [facility] is unable to meet her needs it would be private pay to transition her to a higher level of care with hospice   Patient Goals and CMS Choice            Expected Discharge Plan and Services                                              Prior Living Arrangements/Services                       Activities of Daily Living   ADL Screening (condition at time of admission) Independently performs ADLs?: No Does the patient have a NEW difficulty with bathing/dressing/toileting/self-feeding that is expected to last >3 days?: Yes (Initiates electronic notice to provider for possible OT consult) Does the patient have a NEW difficulty with getting in/out of bed, walking, or climbing stairs that is expected to last >3 days?: Yes (Initiates electronic notice to provider for possible PT consult) Does the patient have a NEW difficulty with communication that is expected to last >3 days?: Yes (Initiates electronic notice to provider for possible SLP consult) Is the patient deaf or have difficulty hearing?: No Does the patient have difficulty seeing, even when wearing glasses/contacts?: No Does the patient have difficulty concentrating, remembering, or  making decisions?: No  Permission Sought/Granted                  Emotional Assessment              Admission diagnosis:  Altered mental status [R41.82] Fall, initial encounter [W19.XXXA] Urinary tract infection without hematuria, site unspecified [N39.0] Altered mental status, unspecified altered mental status type [R41.82] Adverse effect of drug, initial encounter [T50.905A] Patient Active Problem List   Diagnosis Date Noted   Altered mental status 11/29/2023   Dementia without behavioral disturbance (HCC) 11/29/2023   UTI (urinary tract infection) 11/29/2023   Falling episodes 11/29/2023   Elevated CK 11/29/2023   Aortic atherosclerosis (HCC) 11/29/2023   Nausea with vomiting 12/14/2013   Diverticulitis of colon (without mention of hemorrhage)(562.11) 12/14/2013   PCP:  Jyl Railing, MD Pharmacy:  No Pharmacies Listed    Social Drivers of Health (SDOH) Social History: SDOH Screenings   Food Insecurity: No Food Insecurity (11/29/2023)  Housing: Low Risk  (11/29/2023)  Transportation Needs: No Transportation Needs (11/29/2023)  Utilities: Not At Risk (11/29/2023)  Social Connections: Socially Isolated (11/29/2023)  Tobacco Use: Low Risk  (11/29/2023)  SDOH Interventions:     Readmission Risk Interventions     No data to display

## 2023-12-01 NOTE — Consult Note (Addendum)
 Consultation Note Date: 12/01/2023 at 1100  Patient Name: Kathy Hernandez  DOB: May 23, 1929  MRN: [id number]  Age / Sex: 89 y.o., female  PCP: Jyl Railing, MD Referring Physician: Jhonny Calvin NOVAK, MD  HPI/Patient Profile: 89 y.o. female  with past medical history of dementia, bilateral hip arthropathy right greater than left, GERD, insomnia admitted on 11/29/2023 with altered mental status with recent falls.  Patient is being treated for AMS with etiology workup in progress, elevated CK with mild rhabdomyolysis and UTI has been ruled out.  PMT was consulted to support patient and family with goals of care discussions.   Clinical Assessment and Goals of Care: Extensive chart review completed prior to meeting patient including labs, vital signs, imaging, progress notes, orders, and available advanced directive documents from current and previous encounters. I then met with patient and her grand son's wife Kathy Hernandez at bedside to discuss diagnosis prognosis, GOC, EOL wishes, disposition and options.  Patient is asleep but easily awakens to my presence.  Once awake, she makes eye contact, but is minimally reactive or responsive.  She is unable to participate in medical decision-making or goals of care independently at this time.  Kathy Hernandez provides all information during my visit.  Symptoms assessed.  Kathy Hernandez denies that patient has appeared to be uncomfortable with nonverbal signs such as fidgeting, moaning/groaning, wincing, or calling out in pain.  No adjustment to [facility] needed at this time  I introduced Palliative Medicine as specialized medical care for people living with serious illness. It focuses on providing relief from the symptoms and stress of a serious illness. The goal is to improve quality of life for both the patient and the family.  We discussed a brief life review of the patient.  Patient is a  widow and has 2 living children.  Her son Kathy Hernandez is her next of kin and surrogate Management consultant.  Kathy Hernandez shares that patient enjoyed quilting, taking care of her grandchildren, and going to her church.  Patient has been a resident at [facility] for approximately 6 years.  Family shares that she has had a slow but steady decline during her time of admission.  She wants walk with a walker, participated in community games, and was able to feed herself.  However, over the last few weeks, patient has not left her room, has been admitted out of bed, is unable to feed herself, and is more confused.  Kathy Hernandez shares that patient's son Kathy Hernandez is levelheaded and reasonable.  However, it the patient's significant decline over the last few weeks has been difficult to accept.  Education provided on dementia is a chronic, progressive, and irreversible disease that is often exacerbated by acute hospitalizations and illnesses.  Reviewed that in addition to patient's dementia, patient has recently been treated for UTI with ciprofloxacin.  Some evidence shows that ciprofloxacin can increase confusion in patients with advanced age.  Reviewed there are multiple factors at play contributing to patient's overall poor functional, cognitive, and nutritional status.  Advance directives, concepts specific to  code status, artificial feeding and hydration, and rehospitalization were considered and discussed.  Kathy Hernandez shares she instructed Kathy Hernandez it is patient's Management consultant at this time.  Family is facing treatment option decisions, advanced directive, and anticipatory care needs.  Kathy Hernandez shares concern that patient may not be a return to [facility].  Discussed that patient's new baseline and requirements for assistance with ADLs may be different after this hospitalization.   Discussed with patient/family the importance of continued conversation with family and the medical providers regarding overall plan of care and treatment  options, ensuring decisions are within the context of the patient's values and GOCs.    Kathy Hernandez said she will pass on PMT contact info for the patient's son Kathy Hernandez.  I advised her that he can reach out to me and I will also plan to call him towards the end of the day if he has not come to visit in person.  Counseled with attending and TOC.  Attending has spoken with patient's son Kathy Hernandez who is inquiring about whether patient can return to [facility] with hospice services to follow.  TOC status engaged and looking into this as a potential discharge option for patient.  - Attempted to speak with patient's son over the phone.  Spoke with Kathy Hernandez.  He shares he is here at the hospital but has stepped away from the bedside if symptoms are coming left his mother.  I offered to meet his bedside and he was in agreement to meet with me at bedside some time later today.   Addendum: Met with patient's son at bedside.  Discussed patient's current medical status, prognosis and next steps.  Kathy Hernandez endorses that he is awaiting response from other manage to see if patient can return there with hospice services.  Hospice services and philosophy as well as aging in place reviewed.  Symptom management and focusing on comfort discussed.  Kathy Hernandez shares that it has been a significant and steep decline over the past few weeks.  Emotional support provided.  He was grateful for our discussion.  Kathy Hernandez has PMT contact info and was advised to reach out with any acute palliative needs.  He was in agreement for PMT to continue to follow and support.  Primary Decision Maker NEXT OF KIN  Physical Exam Vitals reviewed.  Constitutional:      General: She is not in acute distress.    Appearance: She is normal weight.  HENT:     Head: Normocephalic.     Mouth/Throat:     Mouth: Mucous membranes are moist.  Eyes:     Pupils: Pupils are equal, round, and reactive to light.  Pulmonary:     Effort: Pulmonary effort is normal.  Abdominal:      Palpations: Abdomen is soft.  Neurological:     Mental Status: She is alert.     Comments: Oriented to self  Psychiatric:        Mood and Affect: Mood normal.        Behavior: Behavior normal.     Palliative Assessment/Data: 30%     Thank you for this consult. Palliative medicine will continue to follow and assist holistically.   Time Total: 75 minutes  Time spent includes: Detailed review of medical records (labs, imaging, vital signs), medically appropriate exam (mental status, respiratory, cardiac, skin), discussed with treatment team, counseling and educating patient, family and staff, documenting clinical information, medication management and coordination of care.  Signed by: Lamarr Gunner, DNP, FNP-BC Palliative Medicine  Please contact Palliative Medicine Team providers via AM[facility] for questions and concerns.

## 2023-12-01 NOTE — Progress Notes (Signed)
 PROGRESS NOTE    Kathy Hernandez  FMW:[id number] DOB: [birth year] DOA: 11/29/2023 PCP: Jyl Railing, MD    Brief Narrative:   89 year old female with history of dementia, bilateral hip arthropathy right greater than left, GERD, insomnia, who comes to the ED via EMS from [facility] for chief concerns of altered mentation, increasing falls.   At bedside, patient murmurs incoherently.  She was able to tell me her first name.  She was not able to tell me her age, current calendar year, current location.   Son reports that patient has been more confused for the last 3 to 4 days.  He reports that the facility did not report nausea, vomiting, diarrhea, fever.   Assessment & Plan:   Principal Problem:   Altered mental status Active Problems:   Dementia without behavioral disturbance (HCC)   UTI (urinary tract infection)   Falling episodes   Elevated CK   Aortic atherosclerosis (HCC)  Altered mental status Etiology workup in progress Still on differential that this is infectious encephalopathy however repeat urinalysis is inconsistent with infection, negative procalcitonin, no white count, no fever Progression of underlying cognitive decline is also on differential as is subclinical stroke MRI brain negative for acute issues.  Demonstrates gliosis/encephalomalacia concerning for remote infarct Plan: No further antibiotics.  At this point I suspect that primary etiology is progression of underlying dementia/cognitive decline.  In my opinion the patient is appropriate for further de-escalation of care and engagement of hospice services.  I have discussed my recommendation at length with patient's son at bedside on 08/11.  He is in agreement to find out more information about hospice.  I have notified palliative care service as well as TOC.  Recommend we engage hospice liaison while inpatient.  Elevated CK Mild rhabdomyolysis.  Appears resolved.  DC IV fluids.  Falling episodes With  altered mentation Strongly suspect progression of underlying cognitive decline.  No indication of infectious encephalopathy.   UTI (urinary tract infection) Ruled out   Dementia without behavioral disturbance (HCC) Multiple donepezil  and memantine    DVT prophylaxis: Subcu Lovenox  Code Status: DNR/DNI Family Communication: Son at bedside 8/10, 8/11 Disposition Plan: Status is: Inpatient Remains inpatient appropriate because: Encephalopathy, cognitive decline, functional decline     Level of care: Telemetry Medical  Consultants:  None  Procedures:  None  Antimicrobials:   Subjective: Seen and examined.  Son at bedside.  Patient mumbling but is able to tell me her name.  Unable to verbalize any further complaints.  Objective: Vitals:   11/30/23 1513 11/30/23 2019 12/01/23 0414 12/01/23 0817  BP: 130/70 132/67 (!) 146/61 127/69  Pulse: 80 87 69 67  Resp: 20 15 16 18   Temp: 98.7 F (37.1 C) 98.4 F (36.9 C) 98.3 F (36.8 C) 98.3 F (36.8 C)  TempSrc: Oral Oral Oral Oral  SpO2: 93% 95% 94% 96%  Weight:      Height:        Intake/Output Summary (Last 24 hours) at 12/01/2023 1429 Last data filed at 12/01/2023 1026 Gross per 24 hour  Intake 2083.01 ml  Output --  Net 2083.01 ml   Filed Weights   11/29/23 1300 11/29/23 1500  Weight: 83.9 kg 83.9 kg    Examination:  General exam: Appears frail Respiratory system: Crackles.  Shallow respirations.  Normal work of breathing.  Room air Cardiovascular system: 1 S2, RRR, no murmur, no pedal edema Gastrointestinal system: soft, NT/ND, normal bowel sounds Central nervous system: Alert.  Dysarthric speech.  Oriented  x 1. Extremities: Symmetric 5 x 5 power. Skin: No rashes, lesions or ulcers Psychiatry: Judgement and insight appear impaired. Mood & affect flattened.     Data Reviewed: I have personally reviewed following labs and imaging studies  CBC: Recent Labs  Lab 11/29/23 1310 11/30/23 0455  WBC 10.4  7.6  NEUTROABS 8.9*  --   HGB 11.3* 10.0*  HCT 34.8* 31.1*  MCV 92.1 92.3  PLT 267 241   Basic Metabolic Panel: Recent Labs  Lab 11/29/23 1310 11/30/23 0455  NA 137 139  K 3.9 3.3*  CL 103 104  CO2 26 27  GLUCOSE 122* 94  BUN 17 13  CREATININE 0.65 0.52  CALCIUM 8.7* 8.2*   GFR: Estimated Creatinine Clearance: 48.9 mL/min (by C-G formula based on SCr of 0.52 mg/dL). Liver Function Tests: Recent Labs  Lab 11/29/23 1310  AST 25  ALT 16  ALKPHOS 67  BILITOT 0.8  PROT 6.6  ALBUMIN 3.6   No results for input(s): LIPASE, AMYLASE in the last 168 hours. No results for input(s): AMMONIA in the last 168 hours. Coagulation Profile: No results for input(s): INR, PROTIME in the last 168 hours. Cardiac Enzymes: Recent Labs  Lab 11/29/23 1310 11/30/23 0455  CKTOTAL 335* 380*   BNP (last 3 results) No results for input(s): PROBNP in the last 8760 hours. HbA1C: No results for input(s): HGBA1C in the last 72 hours. CBG: No results for input(s): GLUCAP in the last 168 hours. Lipid Profile: No results for input(s): CHOL, HDL, LDLCALC, TRIG, CHOLHDL, LDLDIRECT in the last 72 hours. Thyroid  Function Tests: No results for input(s): TSH, T4TOTAL, FREET4, T3FREE, THYROIDAB in the last 72 hours. Anemia Panel: No results for input(s): VITAMINB12, FOLATE, FERRITIN, TIBC, IRON, RETICCTPCT in the last 72 hours. Sepsis Labs: Recent Labs  Lab 11/30/23 0454  PROCALCITON <0.10    Recent Results (from the past 240 hours)  Urine Culture     Status: None   Collection Time: 11/29/23  3:21 PM   Specimen: Urine, Clean Catch  Result Value Ref Range Status   Specimen Description   Final    URINE, CLEAN CATCH Performed at [facility], [address], [place], [place] [address]    Special Requests   Final    NONE Performed at [facility], [address] [place], [place] [address]    Culture   Final    NO  GROWTH Performed at [facility], [address], [place], [place] [address]    Report Status 11/30/2023 FINAL  Final         Radiology Studies: MR BRAIN WO CONTRAST Result Date: 11/30/2023 EXAM DESCRIPTION: MR BRAIN WO CONTRAST CLINICAL HISTORY: Mental status change, unknown cause COMPARISON: None available TECHNIQUE: Non contrast MRI of the brain is performed according to our usual protocol including multiplanar multi sequence technique. FINDINGS: No acute intracranial hemorrhage, mass, edema, or hydrocephalus. Mild cortical atrophy. Small encephalomalacia defect in the left anterior temporal lobe consistent with prior infarct or prior trauma. No other encephalomalacia defects. Moderate white matter disease suggesting chronic small vessel ischemic change. The vascular flow voids are unremarkable. No significant sinus disease. IMPRESSION: Age-related change without acute intracranial pathology. Remote insult to the anterior left temporal lobe with focal encephalomalacia/gliosis. Electronically signed by: Reyes Frees MD 11/30/2023 02:16 PM EDT RP Workstation: [id number]   DG Chest Port 1 View Result Date: 11/29/2023 CLINICAL DATA:  Altered mental status EXAM: PORTABLE CHEST 1 VIEW COMPARISON:  02/25/2018 acute abdomen series  FINDINGS: Numerous leads and wires project over the chest. Midline trachea. Normal heart size. Atherosclerosis in the transverse aorta. No pleural effusion or pneumothorax. Left-greater-than-right base volume loss and scarring/atelectasis, similar. No superimposed lobar consolidation. Cholecystectomy clips. IMPRESSION: 1. No acute cardiopulmonary disease. 2. Aortic Atherosclerosis (ICD10-I70.0). Electronically Signed   By: Rockey Kilts M.D.   On: 11/29/2023 15:48   DG Shoulder Right Result Date: 11/29/2023 CLINICAL DATA:  Fall. EXAM: RIGHT SHOULDER - 2+ VIEW COMPARISON:  None Available. FINDINGS: No acute osseous or joint abnormality. Visualized right chest is  unremarkable. IMPRESSION: No acute findings. Electronically Signed   By: Newell Eke M.D.   On: 11/29/2023 15:48        Scheduled Meds:  donepezil   10 mg Oral QHS   enoxaparin  (LOVENOX ) injection  40 mg Subcutaneous Q24H   melatonin  5 mg Oral QHS   memantine   10 mg Oral BID   Continuous Infusions:     LOS: 1 day     Calvin KATHEE Robson, MD [facility]   If 7PM-7AM, please contact night-coverage  12/01/2023, 2:29 PM

## 2023-12-01 NOTE — Progress Notes (Signed)
 SLP Cancellation Note  Patient Details Name: Kathy Hernandez MRN: [id number] DOB: 04/30/1929   Cancelled treatment:       Reason Eval/Treat Not Completed: SLP screened, no needs identified, will sign off  Pt seen for cognitive communication screen in the setting of AMS. MRI revealing Age-related change without acute intracranial pathology. Remote insult to the anterior left temporal lobe with focal encephalomalacia/gliosis. Family (granddaughter, granddaughter in Social worker) present for session. Pt oriented to self. Answering bio questions with inconsistent accuracy. Family indicating that current cognitive function is 8/10 (with 10/10 being baseline).   Education shared regarding recommendations for promoting cognition function in the hospital (sleep/rest at night, routine, eating, family engagement). Family reported understanding. Complete cognitive assessment deferred to completion in familiar setting s/p medical/infection intervention given lack of acute neuro findings. MD aware of recommendations for SLP services at discharge. Family in agreement with plan.   [place] Javier Gell Clapp, MS, CCC-SLP Speech Language Pathologist Rehab Services; [facility] [phone] (ascom)     [place] J Clapp 12/01/2023, 1:40 PM

## 2023-12-02 DIAGNOSIS — W19XXXA Unspecified fall, initial encounter: Secondary | ICD-10-CM

## 2023-12-02 DIAGNOSIS — R404 Transient alteration of awareness: Secondary | ICD-10-CM | POA: Diagnosis not present

## 2023-12-02 DIAGNOSIS — N390 Urinary tract infection, site not specified: Secondary | ICD-10-CM | POA: Diagnosis not present

## 2023-12-02 DIAGNOSIS — F0390 Unspecified dementia without behavioral disturbance: Secondary | ICD-10-CM | POA: Diagnosis not present

## 2023-12-02 DIAGNOSIS — Z515 Encounter for palliative care: Secondary | ICD-10-CM | POA: Diagnosis not present

## 2023-12-02 NOTE — Care Management Important Message (Signed)
 Important Message  Patient Details  Name: Kathy Hernandez MRN: [id number] Date of Birth: [birth year]   Important Message Given:  Yes - Medicare IM     [name] [name] 12/02/2023, 10:56 AM

## 2023-12-02 NOTE — Progress Notes (Signed)
 Physical Therapy Treatment Patient Details Name: Kathy Hernandez MRN: [id number] DOB: 1929-11-16 Today's Date: 12/02/2023   History of Present Illness Pt is a 89 y/o F admitted on 11/29/23 after presenting from [facility] with chief concerns of AMS, increasing falls. Pt currently being treated for UTI. PMH: dementia, B hip arthroplasty, GERD, insomnia    PT Comments  Pt pleasantly confused but put forth fair effort during the session and followed some 1-step commands with extra time and cuing.  Pt required extensive +2 assist with all functional tasks per below but was able to come to standing multiple times during the session and took several small, shuffling steps at the EOB.  Pt required +2 assist while taking small, shuffling steps to prevent LOB and was not safe to take steps away from the bed to the chair so sara stedy was utilized to assist pt from bed to chair, nsg notified.  Pt will benefit from continued PT services upon discharge to safely address deficits listed in patient problem list for decreased caregiver assistance and eventual return to PLOF.     If plan is discharge home, recommend the following: Two people to help with walking and/or transfers;Direct supervision/assist for medications management;Two people to help with bathing/dressing/bathroom;Supervision due to cognitive status;Assistance with cooking/housework;Assist for transportation   Can travel by private vehicle     No  Equipment Recommendations  Other (comment) (TBD at next venue of care)    Recommendations for Other Services       Precautions / Restrictions Precautions Precautions: Fall Recall of Precautions/Restrictions: Impaired Restrictions Weight Bearing Restrictions Per Provider Order: No     Mobility  Bed Mobility Overal bed mobility: Needs Assistance Bed Mobility: Supine to Sit     Supine to sit: Max assist, HOB elevated, +2 for physical assistance, Used rails     General bed mobility  comments: +2 max A for BLE and trunk control; minimal pt effort even with max multi-modal cues    Transfers Overall transfer level: Needs assistance Equipment used: Rolling walker (2 wheels) Transfers: Sit to/from Stand Sit to Stand: Max assist, +2 physical assistance, +2 safety/equipment           General transfer comment: Pt able to come to standing x 3 from EOB with +2 max A and max cuing for sequencing with pt requiring extensive assist initially to prevent posterior LOB but static standing balance training did grossly improve as session progressed; utilized sara stedy for transfer from bed to recliner Transfer via Lift Equipment: Camie Huff  Ambulation/Gait Ambulation/Gait assistance: Mod assist, +2 physical assistance Gait Distance (Feet): 2 Feet Assistive device: Rolling walker (2 wheels) Gait Pattern/deviations: Step-to pattern, Trunk flexed, Shuffle Gait velocity: decreased     General Gait Details: Pt able to take several small, shuffling steps near the EOB with +2 mod A for stability and to guide the RW   Stairs             Wheelchair Mobility     Tilt Bed    Modified Rankin (Stroke Patients Only)       Balance Overall balance assessment: Needs assistance Sitting-balance support: Feet supported Sitting balance-Leahy Scale: Poor Sitting balance - Comments: Poor static sitting balance initially but progressed to fair during the session   Standing balance support: Bilateral upper extremity supported, Reliant on assistive device for balance, During functional activity Standing balance-Leahy Scale: Poor  Communication Communication Communication: Impaired Factors Affecting Communication: Hearing impaired  Cognition Arousal: Alert Behavior During Therapy: WFL for tasks assessed/performed   PT - Cognitive impairments: History of cognitive impairments                         Following commands:  Impaired Following commands impaired: Follows one step commands inconsistently    Cueing Cueing Techniques: Verbal cues, Tactile cues  Exercises      General Comments        Pertinent Vitals/Pain Pain Assessment Pain Assessment: No/denies pain    Home Living                          Prior Function            PT Goals (current goals can now be found in the care plan section) Progress towards PT goals: Progressing toward goals    Frequency    Min 2X/week      PT Plan      Co-evaluation              AM-PAC PT 6 Clicks Mobility   Outcome Measure  Help needed turning from your back to your side while in a flat bed without using bedrails?: A Lot Help needed moving from lying on your back to sitting on the side of a flat bed without using bedrails?: Total Help needed moving to and from a bed to a chair (including a wheelchair)?: Total Help needed standing up from a chair using your arms (e.g., wheelchair or bedside chair)?: Total Help needed to walk in hospital room?: Total Help needed climbing 3-5 steps with a railing? : Total 6 Click Score: 7    End of Session Equipment Utilized During Treatment: Gait belt Activity Tolerance: Patient tolerated treatment well Patient left: in chair;with call bell/phone within reach;with chair alarm set;with family/visitor present Nurse Communication: Mobility status;Need for lift equipment PT Visit Diagnosis: Unsteadiness on feet (R26.81);Other abnormalities of gait and mobility (R26.89);Difficulty in walking, not elsewhere classified (R26.2);Muscle weakness (generalized) (M62.81);History of falling (Z91.81)     Time: 9056-8982 PT Time Calculation (min) (ACUTE ONLY): 34 min  Charges:    $Therapeutic Activity: 23-37 mins PT General Charges $$ ACUTE PT VISIT: 1 Visit                     D. Scott Andra Heslin PT, DPT 12/02/23, 11:49 AM

## 2023-12-02 NOTE — Plan of Care (Signed)

## 2023-12-02 NOTE — Progress Notes (Signed)
 PROGRESS NOTE    Kathy Hernandez  FMW:[id number] DOB: 1929/12/16 DOA: 11/29/2023 PCP: Jyl Railing, MD    Brief Narrative:   89 year old female with history of dementia, bilateral hip arthropathy right greater than left, GERD, insomnia, who comes to the ED via EMS from [facility] for chief concerns of altered mentation, increasing falls.   At bedside, patient murmurs incoherently.  She was able to tell me her first name.  She was not able to tell me her age, current calendar year, current location.   Son reports that patient has been more confused for the last 3 to 4 days.  He reports that the facility did not report nausea, vomiting, diarrhea, fever.  8/12: After multiple discussions with the patient, patient's son and daughter-in-law decision made to engage outpatient hospice/palliative services.  TOC working with facility [facility] to determine if patient can return there with hospice services.   Assessment & Plan:   Principal Problem:   Altered mental status Active Problems:   Dementia without behavioral disturbance (HCC)   UTI (urinary tract infection)   Falling episodes   Elevated CK   Aortic atherosclerosis (HCC)  Altered mental status No acute issues that are driving encephalopathy.  I suspect this is a progression of her underlying cognitive decline. Plan: No further antibiotics.  At this point I suspect that primary etiology is progression of underlying dementia/cognitive decline.  Patient is appropriate for further de-escalation of care and engagement of hospice services.  Son and daughter-in-law are in agreement with plan.  TOC aware and working to see if [facility] can accept the patient back with hospice services. First TOC on 8/12 RN at [facility] will have to escalate this request to the regional level  Elevated CK Mild rhabdomyolysis.  Appears resolved.  DC IV fluids.  Falling episodes With altered mentation Strongly suspect progression of  underlying cognitive decline.   No indication of infectious encephalopathy. No indication for antibiotics   UTI (urinary tract infection) Ruled out   Dementia without behavioral disturbance (HCC) Multiple donepezil  and memantine    DVT prophylaxis: Subcu Lovenox  Code Status: DNR/DNI Family Communication: Son at bedside 8/10, 8/11, daughter-in-law at bedside 8/12 Disposition Plan: Status is: Inpatient Remains inpatient appropriate because: Encephalopathy, cognitive decline, functional decline     Level of care: Telemetry Medical  Consultants:  None  Procedures:  None  Antimicrobials:   Subjective: Seen and examined.  Sitting up in chair.  No appreciable improvement in functional status for patient.  Objective: Vitals:   12/01/23 2106 12/02/23 0425 12/02/23 0500 12/02/23 0754  BP: (!) 141/65 (!) 148/68  (!) 118/58  Pulse: 80 68  64  Resp: 15 15  14   Temp: 98.3 F (36.8 C) 98.1 F (36.7 C)  97.7 F (36.5 C)  TempSrc: Oral Oral    SpO2: 94% 91%  96%  Weight:   78.7 kg   Height:        Intake/Output Summary (Last 24 hours) at 12/02/2023 1358 Last data filed at 12/02/2023 1100 Gross per 24 hour  Intake 300 ml  Output --  Net 300 ml   Filed Weights   11/29/23 1300 11/29/23 1500 12/02/23 0500  Weight: 83.9 kg 83.9 kg 78.7 kg    Examination:  General exam: Frail-appearing.  Chronically ill Respiratory system: Overall clear.  Shallow respirations.  Normal work of breathing.  Room air Cardiovascular system: 1 S2, RRR, no murmur, no pedal edema Gastrointestinal system: soft, NT/ND, normal bowel sounds Central nervous system:  Alert.  Dysarthric speech.  Oriented x 1. Extremities: Symmetric 5 x 5 power. Skin: No rashes, lesions or ulcers Psychiatry: Judgement and insight appear impaired. Mood & affect flattened.     Data Reviewed: I have personally reviewed following labs and imaging studies  CBC: Recent Labs  Lab 11/29/23 1310 11/30/23 0455  WBC 10.4  7.6  NEUTROABS 8.9*  --   HGB 11.3* 10.0*  HCT 34.8* 31.1*  MCV 92.1 92.3  PLT 267 241   Basic Metabolic Panel: Recent Labs  Lab 11/29/23 1310 11/30/23 0455  NA 137 139  K 3.9 3.3*  CL 103 104  CO2 26 27  GLUCOSE 122* 94  BUN 17 13  CREATININE 0.65 0.52  CALCIUM 8.7* 8.2*   GFR: Estimated Creatinine Clearance: 47.5 mL/min (by C-G formula based on SCr of 0.52 mg/dL). Liver Function Tests: Recent Labs  Lab 11/29/23 1310  AST 25  ALT 16  ALKPHOS 67  BILITOT 0.8  PROT 6.6  ALBUMIN 3.6   No results for input(s): LIPASE, AMYLASE in the last 168 hours. No results for input(s): AMMONIA in the last 168 hours. Coagulation Profile: No results for input(s): INR, PROTIME in the last 168 hours. Cardiac Enzymes: Recent Labs  Lab 11/29/23 1310 11/30/23 0455  CKTOTAL 335* 380*   BNP (last 3 results) No results for input(s): PROBNP in the last 8760 hours. HbA1C: No results for input(s): HGBA1C in the last 72 hours. CBG: No results for input(s): GLUCAP in the last 168 hours. Lipid Profile: No results for input(s): CHOL, HDL, LDLCALC, TRIG, CHOLHDL, LDLDIRECT in the last 72 hours. Thyroid  Function Tests: No results for input(s): TSH, T4TOTAL, FREET4, T3FREE, THYROIDAB in the last 72 hours. Anemia Panel: No results for input(s): VITAMINB12, FOLATE, FERRITIN, TIBC, IRON, RETICCTPCT in the last 72 hours. Sepsis Labs: Recent Labs  Lab 11/30/23 0454  PROCALCITON <0.10    Recent Results (from the past 240 hours)  Urine Culture     Status: None   Collection Time: 11/29/23  3:21 PM   Specimen: Urine, Clean Catch  Result Value Ref Range Status   Specimen Description   Final    URINE, CLEAN CATCH Performed at [facility], [address] [place], [place] [address]    Special Requests   Final    NONE Performed at [facility], [address], [place], [place] [address]    Culture   Final    NO  GROWTH Performed at [facility], [address] [place], [place] [address]    Report Status 11/30/2023 FINAL  Final         Radiology Studies: No results found.       Scheduled Meds:  donepezil   10 mg Oral QHS   enoxaparin  (LOVENOX ) injection  40 mg Subcutaneous Q24H   melatonin  5 mg Oral QHS   memantine   10 mg Oral BID   Continuous Infusions:     LOS: 2 days     Calvin KATHEE Robson, MD [facility]   If 7PM-7AM, please contact night-coverage  12/02/2023, 1:58 PM

## 2023-12-02 NOTE — TOC Progression Note (Addendum)
 Transition of Care [facility]) - Progression Note    Patient Details  Name: Kathy Hernandez MRN: [id number] Date of Birth: Apr 02, 1930  [facility] CM/SW Contact  [name], RN Phone Number: 12/02/2023, 11:50 AM  Clinical Narrative:      [name] with [facility] checking with staff to confirm if patient can return with hospice services        Update:  spoke with RN Lonell at [facility].  Progress notes, and therapy notes sent secure email for review.  She states that it will have to be reviewed by regional to determine if patient can return with hospice services             Expected Discharge Plan and Services                                               Social Drivers of Health (SDOH) Interventions SDOH Screenings   Food Insecurity: No Food Insecurity (11/29/2023)  Housing: Low Risk  (11/29/2023)  Transportation Needs: No Transportation Needs (11/29/2023)  Utilities: Not At Risk (11/29/2023)  Social Connections: Socially Isolated (11/29/2023)  Tobacco Use: Low Risk  (11/29/2023)    Readmission Risk Interventions     No data to display

## 2023-12-02 NOTE — Progress Notes (Signed)
 Palliative Care Progress Note, Assessment & Plan   Patient Name: Kathy Hernandez       Date: 12/02/2023 DOB: [birth year]  Age: 89 y.o. MRN#: [id number] Attending Physician: Jhonny Calvin NOVAK, MD Primary Care Physician: Jyl Railing, MD Admit Date: 11/29/2023  Subjective: Patient is out of bed and sitting in the recliner.  She is, alert, and acknowledges my presence.  Her daughter and granddaughters wife are at bedside as well as a friend during my visit.  HPI: 89 y.o. female  with past medical history of dementia, bilateral hip arthropathy right greater than left, GERD, insomnia admitted on 11/29/2023 with altered mental status with recent falls.   Patient is being treated for AMS with etiology workup in progress, elevated CK with mild rhabdomyolysis and UTI has been ruled out.   PMT was consulted to support patient and family with goals of care discussions.   Summary of counseling/coordination of care: Extensive chart review completed prior to meeting patient including labs, vital signs, imaging, progress notes, orders, and available advanced directive documents from current and previous encounters.   After reviewing the patient's chart and assessing the patient at bedside, I spoke with patient in regards to symptom management and goals of care.   Symptoms assessed.  Patient complains of pain in her left ear.  Physical assessment of ear completed with otoscope.  Tympanic membrane is intact with cone of light appropriately at 7 o'clock position.  Ear canal looks mildly inflamed and red but no open lesions noted.  Patient has had Tylenol  earlier this morning and endorses no pain in her ear now.  Patient denies pain or discomfort, headache, chest pain, and other acute issues at this time.  No adjustment  to [facility] needed.  I discussed the next steps with patient and family at bedside.  They continue to await response from medicine manage to see if patient can return there with hospice services.  Symptom burden is low.  Goals are clear.  PMT will step back from daily visits and monitor the patient peripherally.  Please re-engage with PMT if goals change, at patient/family's request, or if patient's health deteriorates during hospitalization.    Physical Exam HENT:     Right Ear: Tympanic membrane normal.     Left Ear: Tympanic membrane, ear canal and external ear normal. No drainage.  No middle ear effusion. There is no impacted cerumen. No mastoid tenderness.     Mouth/Throat:     Mouth: Mucous membranes are moist.  Eyes:     Pupils: Pupils are equal, round, and reactive to light.  Pulmonary:     Effort: Pulmonary effort is normal.  Abdominal:     Palpations: Abdomen is soft.  Skin:    General: Skin is warm and dry.  Neurological:     Comments: Oriented to self  Psychiatric:        Mood and Affect: Mood normal.        Behavior: Behavior normal.             Total Time 35 minutes   Time spent includes: Detailed review of medical records (labs, imaging, vital signs), medically appropriate exam (mental status, respiratory, cardiac, skin), discussed with treatment  team, counseling and educating patient, family and staff, documenting clinical information, medication management and coordination of care.  Lamarr L. Arvid, DNP, FNP-BC Palliative Medicine Team

## 2023-12-03 DIAGNOSIS — R404 Transient alteration of awareness: Secondary | ICD-10-CM | POA: Diagnosis not present

## 2023-12-03 NOTE — Progress Notes (Signed)
 Patient is alert,disorientedx4. Purewick is in place but no ouput in cannister.Bladder scan from previous shift shows .In and out cath with output ,clear yellow urine.Bed locked in lowest position.Bed alarm on.

## 2023-12-03 NOTE — Progress Notes (Signed)
  PROGRESS NOTE    Kathy Hernandez  FMW:[id number] DOB: 07-27-1929 DOA: 11/29/2023 PCP: Jyl Railing, MD  [id number]  LOS: 3 days   Brief hospital course:   Assessment & Plan: 89 year old female with history of dementia, bilateral hip arthropathy right greater than left, GERD, insomnia, who comes to the ED via EMS from [facility] for chief concerns of altered mentation, increasing falls.   8/12: After multiple discussions with the patient, patient's son and daughter-in-law decision made to engage outpatient hospice/palliative services.    Altered mental status Baseline dementia No acute issues that are driving encephalopathy.  I suspect this is a progression of her underlying cognitive decline. Plan: No further antibiotics.   --discharge back to facility with hospice   Elevated CK Mild rhabdomyolysis.  Appears resolved.    Falling episodes With altered mentation Strongly suspect progression of underlying cognitive decline.   No indication of infectious encephalopathy. No indication for antibiotics   UTI (urinary tract infection) Ruled out   Dementia without behavioral disturbance (HCC) --cont donepezil  and memantine   Hypokalemia --supplement PRN   DVT prophylaxis: Lovenox  SQ Code Status: DNR  Family Communication: son updated at bedside today Level of care: Telemetry Medical Dispo:   The patient is from: Memory care  Anticipated d/c is to: Memory care with hospice Anticipated d/c date is: tomorrow   Subjective and Interval History:  Per son, pt had poor appetite, but more alert and sitting up in chair today.   Objective: Vitals:   12/03/23 0335 12/03/23 0824 12/03/23 1648 12/03/23 2029  BP: (!) 129/47 134/60 (!) 142/63 114/70  Pulse: 75 71 82 72  Resp: 18 18 16 18   Temp: 99.7 F (37.6 C) 98.6 F (37 C) 97.8 F (36.6 C) (!) 97.4 F (36.3 C)  TempSrc: Oral   Oral  SpO2: 98% 95% 93% 90%  Weight:      Height:        Intake/Output Summary  (Last 24 hours) at 12/03/2023 2147 Last data filed at 12/03/2023 1300 Gross per 24 hour  Intake 60 ml  Output --  Net 60 ml   Filed Weights   11/29/23 1300 11/29/23 1500 12/02/23 0500  Weight: 83.9 kg 83.9 kg 78.7 kg    Examination:   Constitutional: NAD, alert, oriented to self HEENT: conjunctivae and lids normal, EOMI CV: No cyanosis.   RESP: normal respiratory effort, on RA Neuro: II - XII grossly intact.     Data Reviewed: I have personally reviewed labs and imaging studies  Time spent: 35 minutes  Ellouise Haber, MD [facility] If 7PM-7AM, please contact night-coverage 12/03/2023, 9:47 PM

## 2023-12-03 NOTE — Progress Notes (Signed)
 Occupational Therapy Treatment Patient Details Name: Kathy Hernandez MRN: [id number] DOB: 05-21-1929 Today's Date: 12/03/2023   History of present illness Pt is a 89 y/o F admitted on 11/29/23 after presenting from [facility] with chief concerns of AMS, increasing falls. Pt currently being treated for UTI. PMH: dementia, B hip arthroplasty, GERD, insomnia   OT comments  Upon entering the room, pt supine in bed and agreeable to OT intervention with encouragement. Pt seen for skilled co-treatment with PT and OT for pt and therapist safety. Pt oriented to self only and needing increased time and mod-max multimodal cues to initiate and attend to tasks. Pt needing mod A of 2 for bed mobility. She sits on EOB with supervision for static sitting balance. Pt stands with mod A of 2 and use of RW and takes several steps to the R with increased cuing and RW management. Pt sits in recliner chair with call bell and all needed items within reach. Chair alarm activated for safety.       If plan is discharge home, recommend the following:  Two people to help with walking and/or transfers;Two people to help with bathing/dressing/bathroom   Equipment Recommendations  Other (comment) (defer to next venue of care)       Precautions / Restrictions Precautions Precautions: Fall Recall of Precautions/Restrictions: Impaired       Mobility Bed Mobility Overal bed mobility: Needs Assistance Bed Mobility: Supine to Sit     Supine to sit: Mod assist, +2 for physical assistance, HOB elevated          Transfers Overall transfer level: Needs assistance Equipment used: Rolling walker (2 wheels) Transfers: Sit to/from Stand, Bed to chair/wheelchair/BSC Sit to Stand: Mod assist, +2 physical assistance, From elevated surface     Step pivot transfers: Mod assist, +2 physical assistance     General transfer comment: patient able to take a few pivoting steps from bed to recliner with mod +2 and RW.  Constant cues needed to continue mobilizing/stepping.     Balance Overall balance assessment: Needs assistance Sitting-balance support: Feet supported Sitting balance-Leahy Scale: Fair Sitting balance - Comments: able to sit with close supervision   Standing balance support: Bilateral upper extremity supported, During functional activity, Reliant on assistive device for balance Standing balance-Leahy Scale: Poor                             ADL either performed or assessed with clinical judgement   ADL Overall ADL's : Needs assistance/impaired                                       General ADL Comments: simulated toielt transfer mod A of 2 step pivot transfer with RW to recliner chair.    Extremity/Trunk Assessment Upper Extremity Assessment Upper Extremity Assessment: Generalized weakness   Lower Extremity Assessment Lower Extremity Assessment: Generalized weakness        Vision Patient Visual Report: No change from baseline           Communication Communication Communication: Impaired Factors Affecting Communication: Hearing impaired   Cognition Arousal: Alert Behavior During Therapy: WFL for tasks assessed/performed, Flat affect Cognition: History of cognitive impairments, Cognition impaired   Orientation impairments: Place, Time, Situation Awareness: Intellectual awareness impaired, Online awareness impaired       OT - Cognition Comments: Pt asking multiple times  this session about where we were and why she was here.                 Following commands: Impaired Following commands impaired: Follows one step commands inconsistently, Follows multi-step commands with increased time      Cueing   Cueing Techniques: Verbal cues, Tactile cues, Gestural cues, Visual cues             Pertinent Vitals/ Pain       Pain Assessment Pain Assessment: Faces Faces Pain Scale: Hurts little more Pain Location: Leg pain Pain  Descriptors / Indicators: Sore, Discomfort Pain Intervention(s): Monitored during session, Repositioned         Frequency  Min 2X/week        Progress Toward Goals  OT Goals(current goals can now be found in the care plan section)  Progress towards OT goals: Progressing toward goals         Co-evaluation    PT/OT/SLP Co-Evaluation/Treatment: Yes Reason for Co-Treatment: To address functional/ADL transfers;Necessary to address cognition/behavior during functional activity;For patient/therapist safety PT goals addressed during session: Mobility/safety with mobility;Balance OT goals addressed during session: ADL's and self-care      AM-PAC OT 6 Clicks Daily Activity     Outcome Measure   Help from another person eating meals?: A Lot Help from another person taking care of personal grooming?: A Lot Help from another person toileting, which includes using toliet, bedpan, or urinal?: A Lot Help from another person bathing (including washing, rinsing, drying)?: A Lot Help from another person to put on and taking off regular upper body clothing?: A Lot Help from another person to put on and taking off regular lower body clothing?: A Lot 6 Click Score: 12    End of Session Equipment Utilized During Treatment: Rolling walker (2 wheels)  OT Visit Diagnosis: Other abnormalities of gait and mobility (R26.89);Muscle weakness (generalized) (M62.81)   Activity Tolerance Patient limited by fatigue   Patient Left with call bell/phone within reach;in chair;with chair alarm set   Nurse Communication Mobility status;Other (comment) (recommended use of STEDY for transfer)        Time: 8679-8656 OT Time Calculation (min): 23 min  Charges: OT General Charges $OT Visit: 1 Visit OT Treatments $Self Care/Home Management : 8-22 mins  [name], MS, OTR/L , CBIS ascom [phone]  12/03/23, 2:14 PM

## 2023-12-03 NOTE — Progress Notes (Addendum)
 [facility], Room 216 [facility] Liaison Note  Received request from Marinda Cooks, RN, Transitions of Care Manager, for hospice services at [facility] at discharge.  Spoke with son, Daleiza Bacchi, to initiate education related to hospice philosophy, services, and team approach to care.  Son verbalized understanding of information given.   DME needs discussed.  Patient has the following equipment at the facility:  walker and lift chair.    Son requests the following equipment in the home: None at this time.  Son agreeable for hospice nurse to assess for addition DME needs at facility.    The address has been verified and is correct in the chart.   Please send signed and completed DNR home with the patient/family.  Please provide prescriptions at discharge as needed to ensure ongoing symptom management.   [facility] information and contact numbers given to son.   Above information shared with Marinda Cooks PEAK, Transitions of Care Manager.     Please call with any Hospice related questions or concerns.  Thank you for the opportunity to participate in this patient's care.  Marinell Nova, [facility] Liaison [phone]

## 2023-12-03 NOTE — Progress Notes (Signed)
 Physical Therapy Treatment Patient Details Name: Kathy Hernandez MRN: [id number] DOB: 1929/07/14 Today's Date: 12/03/2023   History of Present Illness Pt is a 89 y/o F admitted on 11/29/23 after presenting from [facility] with chief concerns of AMS, increasing falls. Pt currently being treated for UTI. PMH: dementia, B hip arthroplasty, GERD, insomnia    PT Comments  Patient received in bed, she is HOH and confused. Patient requires max cues to initiate mobility. Requires mod +2 assist for bed mobility, transfers and step pivot with RW to recliner. Patient is at high fall risk. She will continue to benefit from skilled PT to improve mobility and independence.      If plan is discharge home, recommend the following: Two people to help with walking and/or transfers;Direct supervision/assist for medications management;Two people to help with bathing/dressing/bathroom;Supervision due to cognitive status;Assistance with cooking/housework;Assist for transportation   Can travel by private vehicle     No  Equipment Recommendations  None recommended by PT    Recommendations for Other Services       Precautions / Restrictions Precautions Precautions: Fall Recall of Precautions/Restrictions: Impaired Restrictions Weight Bearing Restrictions Per Provider Order: No     Mobility  Bed Mobility Overal bed mobility: Needs Assistance Bed Mobility: Supine to Sit     Supine to sit: Mod assist, +2 for physical assistance, HOB elevated          Transfers Overall transfer level: Needs assistance Equipment used: Rolling walker (2 wheels) Transfers: Sit to/from Stand, Bed to chair/wheelchair/BSC Sit to Stand: Mod assist, +2 physical assistance, From elevated surface   Step pivot transfers: Mod assist, +2 physical assistance       General transfer comment: patient able to take a few pivoting steps from bed to recliner with mod +2 and RW. Constant cues needed to continue  mobilizing/stepping.    Ambulation/Gait Ambulation/Gait assistance: Mod assist, +2 physical assistance Gait Distance (Feet): 2 Feet Assistive device: Rolling walker (2 wheels) Gait Pattern/deviations: Step-to pattern Gait velocity: decreased         Stairs             Wheelchair Mobility     Tilt Bed    Modified Rankin (Stroke Patients Only)       Balance Overall balance assessment: Needs assistance Sitting-balance support: Feet supported Sitting balance-Leahy Scale: Fair Sitting balance - Comments: able to sit with close supervision   Standing balance support: Bilateral upper extremity supported, During functional activity, Reliant on assistive device for balance Standing balance-Leahy Scale: Poor                              Communication Communication Communication: Impaired Factors Affecting Communication: Hearing impaired  Cognition Arousal: Alert Behavior During Therapy: WFL for tasks assessed/performed, Flat affect   PT - Cognitive impairments: History of cognitive impairments, No family/caregiver present to determine baseline                       PT - Cognition Comments: Patient requires constant cues to orient self to hospital Following commands: Impaired Following commands impaired: Follows one step commands inconsistently    Cueing Cueing Techniques: Verbal cues, Tactile cues  Exercises      General Comments        Pertinent Vitals/Pain Pain Assessment Pain Assessment: Faces Faces Pain Scale: Hurts little more Pain Location: Leg pain Pain Descriptors / Indicators: Sore Pain Intervention(s): Monitored during session,  Repositioned    Home Living                          Prior Function            PT Goals (current goals can now be found in the care plan section) Acute Rehab PT Goals Patient Stated Goal: none stated PT Goal Formulation: Patient unable to participate in goal setting Time For Goal  Achievement: 12/14/23 Progress towards PT goals: Progressing toward goals    Frequency    Min 2X/week      PT Plan      Co-evaluation PT/OT/SLP Co-Evaluation/Treatment: Yes Reason for Co-Treatment: To address functional/ADL transfers;Necessary to address cognition/behavior during functional activity;For patient/therapist safety PT goals addressed during session: Mobility/safety with mobility;Balance OT goals addressed during session: ADL's and self-care      AM-PAC PT 6 Clicks Mobility   Outcome Measure  Help needed turning from your back to your side while in a flat bed without using bedrails?: A Lot Help needed moving from lying on your back to sitting on the side of a flat bed without using bedrails?: A Lot Help needed moving to and from a bed to a chair (including a wheelchair)?: A Lot Help needed standing up from a chair using your arms (e.g., wheelchair or bedside chair)?: A Lot Help needed to walk in hospital room?: Total Help needed climbing 3-5 steps with a railing? : Total 6 Click Score: 10    End of Session   Activity Tolerance: Patient limited by fatigue Patient left: in chair;with call bell/phone within reach;with chair alarm set Nurse Communication: Mobility status PT Visit Diagnosis: Unsteadiness on feet (R26.81);Other abnormalities of gait and mobility (R26.89);Difficulty in walking, not elsewhere classified (R26.2);Muscle weakness (generalized) (M62.81);History of falling (Z91.81)     Time: 8684-8660 PT Time Calculation (min) (ACUTE ONLY): 24 min  Charges:    $Therapeutic Activity: 8-22 mins PT General Charges $$ ACUTE PT VISIT: 1 Visit                     Caidyn Henricksen, PT, GCS 12/03/23,2:22 PM

## 2023-12-03 NOTE — TOC Progression Note (Addendum)
 Transition of Care [facility]) - Progression Note    Patient Details  Name: Kathy Hernandez MRN: [id number] Date of Birth: 08-14-1929  Transition of Care [facility]) CM/SW Contact  Marinda Cooks, RN Phone Number: 12/03/2023, 8:54 AM  Clinical Narrative:    This CM called and spoke with POC Kathy Hernandez at [phone] at [facility] introduced role to discuss dc plan for pt to return with hospice services , informed Facility Admission Nurse has not provided updates to confirm pt's return at this time. TOC will cont to follow dc planning/ care coordination and update as applicable.     16:16: This CM received call from POC admission Liasion Kathy Hernandez at [facility] informing pt can return with Hospice Services . This CM called and spoke with pt's son Kathy Hernandez at [phone] and updated pt can return to [facility] , also provided choice for [facility] he informed agency of choice is [facility]. This CM updated medical team and [facility] Liaison Jack . TOC will cont to follow dc planning / care coordination and update as applicable.      Expected Discharge Plan and Services    Return to ALF with [facility] .   Social Drivers of Health (SDOH) Interventions SDOH Screenings   Food Insecurity: No Food Insecurity (11/29/2023)  Housing: Low Risk  (11/29/2023)  Transportation Needs: No Transportation Needs (11/29/2023)  Utilities: Not At Risk (11/29/2023)  Social Connections: Socially Isolated (11/29/2023)  Tobacco Use: Low Risk  (11/29/2023)    Readmission Risk Interventions     No data to display

## 2023-12-03 NOTE — Plan of Care (Addendum)

## 2023-12-04 DIAGNOSIS — R404 Transient alteration of awareness: Secondary | ICD-10-CM | POA: Diagnosis not present

## 2023-12-04 MED ORDER — CHLORHEXIDINE GLUCONATE CLOTH 2 % EX PADS: 6.0000 | MEDICATED_PAD | Freq: Every day | CUTANEOUS | Status: DC

## 2023-12-04 NOTE — NC FL2 (Signed)
  [place]  MEDICAID FL2 LEVEL OF CARE FORM     IDENTIFICATION  Patient Name: Kathy Hernandez Birthdate: 1929-08-25 Sex: female Admission Date (Current Location): 11/29/2023  [facility] and [place] Number:  Chiropodist and Address:  [facility], [address] [place], [place] [address]      Provider Number: [id number]  Attending Physician Name and Address:  Awanda City, MD  Relative Name and Phone Number:       Current Level of Care:   Recommended Level of Care:   Prior Approval Number:    Date Approved/Denied:   PASRR Number: [id number]  Discharge Plan: Other (Comment) (ALF [place])    Current Diagnoses: Patient Active Problem List   Diagnosis Date Noted   Altered mental status 11/29/2023   Dementia without behavioral disturbance (HCC) 11/29/2023   UTI (urinary tract infection) 11/29/2023   Falling episodes 11/29/2023   Elevated CK 11/29/2023   Aortic atherosclerosis (HCC) 11/29/2023   Nausea with vomiting 12/14/2013   Diverticulitis of colon (without mention of hemorrhage)(562.11) 12/14/2013    Orientation RESPIRATION BLADDER Height & Weight     Self  Normal Indwelling catheter Weight: 78.7 kg Height:  5' 6 (167.6 cm)  BEHAVIORAL SYMPTOMS/MOOD NEUROLOGICAL BOWEL NUTRITION STATUS     (Dementia w/o behavioral disturbances) Incontinent Diet  AMBULATORY STATUS COMMUNICATION OF NEEDS Skin   Total Care Verbally Normal                       Personal Care Assistance Level of Assistance  Total care           Functional Limitations Info  Sight Sight Info: Impaired        SPECIAL CARE FACTORS FREQUENCY                       Contractures Contractures Info: Not present    Additional Factors Info  Code Status Code Status Info: DNR             Current Medications (12/04/2023):  This is the current hospital active medication list Current Facility-Administered Medications  Medication Dose Route  Frequency Provider Last Rate Last Admin   Chlorhexidine  Gluconate Cloth 2 % PADS 6 each  6 each Topical Daily Awanda City, MD       donepezil  (ARICEPT ) tablet 10 mg  10 mg Oral QHS Cox, Amy N, DO   10 mg at 12/03/23 2220   enoxaparin  (LOVENOX ) injection 40 mg  40 mg Subcutaneous Q24H Cox, Amy N, DO   40 mg at 12/03/23 2220   melatonin tablet 5 mg  5 mg Oral QHS Cox, Amy N, DO   5 mg at 12/03/23 2224   memantine  (NAMENDA ) tablet 10 mg  10 mg Oral BID Cox, Amy N, DO   10 mg at 12/04/23 0844   senna-docusate (Senokot-S) tablet 1 tablet  1 tablet Oral QHS PRN Cox, Amy N, DO         Discharge Medications: Please see discharge summary for a list of discharge medications.  Relevant Imaging Results:  Relevant Lab Results:   Additional Information SS#[id number]  Marinda Cooks, RN

## 2023-12-04 NOTE — Discharge Summary (Addendum)
 Physician Discharge Summary   Kathy Hernandez  female DOB: 09/01/1929  FMW:[id number]  PCP: Kathy Railing, MD  Admit date: 11/29/2023 Discharge date: 12/04/2023  Admitted From: [facility] memory care Disposition:  [facility] with hospice CODE STATUS: DNR  Discharge Instructions     Diet general   Complete by: As directed       Hospital Course:  For full details, please see H&P, progress notes, consult notes and ancillary notes.  Briefly,  Kathy Hernandez is a 89 year old female with history of dementia, bilateral hip arthropathy, who presented to the ED via EMS from [facility] for chief concerns of altered mentation, increasing falls.    8/12: After multiple discussions with the patient, patient's son and daughter-in-law decision made to engage outpatient hospice/palliative services.    Altered mental status Failure to thrive Baseline dementia No acute issues that are driving encephalopathy.  Pt has reduced appetite and oral intake.  Suspect this is a progression of her underlying cognitive decline. --cont home donepezil  and memantine  --discharge back to facility with hospice   Elevated CK --CK 300's.     Falling episodes Strongly suspect progression of underlying cognitive decline.   No evidence of infectious encephalopathy. No indication for antibiotics   UTI (urinary tract infection) Ruled out   Hypokalemia --supplemented PRN  HTN --BP mostly wnl without antihypertensives. --d/c'ed home amlodipine and Lisinopril  Acute urinary retention --Needed I/O on 8/12 and 8/14 for post void >800 ml.  Foley therefore inserted on 12/04/23. --outpatient urology followup.   Unless noted above, medications under STOP list are ones pt was not taking PTA.  Discharge Diagnoses:  Principal Problem:   Altered mental status Active Problems:   Dementia without behavioral disturbance (HCC)   UTI (urinary tract infection)   Falling episodes    Elevated CK   Aortic atherosclerosis (HCC)   30 Day Unplanned Readmission Risk Score    Flowsheet Row ED to Hosp-Admission (Current) from 11/29/2023 in [facility]  30 Day Unplanned Readmission Risk Score (%) 10.74 Filed at 12/04/2023 0801    This score is the patient's risk of an unplanned readmission within 30 days of being discharged (0 -100%). The score is based on dignosis, age, lab data, medications, orders, and past utilization.   Low:  0-14.9   Medium: 15-21.9   High: 22-29.9   Extreme: 30 and above         Discharge Instructions:  Allergies as of 12/04/2023   No Known Allergies      Medication List     STOP taking these medications    amLODipine 5 MG tablet Commonly known as: NORVASC   ciprofloxacin 500 MG tablet Commonly known as: CIPRO   hydrocortisone 2.5 % cream   lisinopril 40 MG tablet Commonly known as: ZESTRIL   ondansetron  4 MG disintegrating tablet Commonly known as: Zofran  ODT       TAKE these medications    acetaminophen  500 MG tablet Commonly known as: TYLENOL  Take 500 mg by mouth 3 (three) times daily.   ammonium lactate 12 % cream Commonly known as: AMLACTIN Apply 1 Application topically as needed for dry skin.   aspirin  EC 81 MG tablet Take 81 mg by mouth daily.   cyanocobalamin 1000 MCG tablet Commonly known as: VITAMIN B12 Take 2,500 mcg by mouth daily.   donepezil  10 MG tablet Commonly known as: ARICEPT  Take 10 mg by mouth at bedtime.   Ensure Take 1 Can  by mouth daily.   esomeprazole 20 MG capsule Commonly known as: NEXIUM Take 20 mg by mouth daily. What changed: Another medication with the same name was removed. Continue taking this medication, and follow the directions you see here.   Lubriderm Lotn Apply 1 Application topically daily.   LYCOPENE PO Take 1 tablet by mouth daily.   melatonin 3 MG Tabs tablet Take 3 mg by mouth at bedtime.   memantine  10 MG  tablet Commonly known as: NAMENDA  Take 10 mg by mouth 2 (two) times daily.   mirtazapine 7.5 MG tablet Commonly known as: REMERON Take 7.5 mg by mouth at bedtime.         Follow-up Information     Kathy Railing, MD. Go on 12/10/2023.   Specialty: Family Medicine Why: Go at 11:00am. Contact information: [address] [place] [address] [phone]         Your hospice provider Follow up.          Vaillancourt, Samantha, PA-C Follow up in 2 week(s).   Specialty: Urology Why: voiding trial Contact information: [address] [place] [address] [phone]                 No Known Allergies   The results of significant diagnostics from this hospitalization (including imaging, microbiology, ancillary and laboratory) are listed below for reference.   Consultations:   Procedures/Studies: MR BRAIN WO CONTRAST Result Date: 11/30/2023 EXAM DESCRIPTION: MR BRAIN WO CONTRAST CLINICAL HISTORY: Mental status change, unknown cause COMPARISON: None available TECHNIQUE: Non contrast MRI of the brain is performed according to our usual protocol including multiplanar multi sequence technique. FINDINGS: No acute intracranial hemorrhage, mass, edema, or hydrocephalus. Mild cortical atrophy. Small encephalomalacia defect in the left anterior temporal lobe consistent with prior infarct or prior trauma. No other encephalomalacia defects. Moderate white matter disease suggesting chronic small vessel ischemic change. The vascular flow voids are unremarkable. No significant sinus disease. IMPRESSION: Age-related change without acute intracranial pathology. Remote insult to the anterior left temporal lobe with focal encephalomalacia/gliosis. Electronically signed by: Reyes Frees MD 11/30/2023 02:16 PM EDT RP Workstation: [id number]   DG Chest Port 1 View Result Date: 11/29/2023 CLINICAL DATA:  Altered mental status EXAM: PORTABLE CHEST 1 VIEW COMPARISON:   02/25/2018 acute abdomen series FINDINGS: Numerous leads and wires project over the chest. Midline trachea. Normal heart size. Atherosclerosis in the transverse aorta. No pleural effusion or pneumothorax. Left-greater-than-right base volume loss and scarring/atelectasis, similar. No superimposed lobar consolidation. Cholecystectomy clips. IMPRESSION: 1. No acute cardiopulmonary disease. 2. Aortic Atherosclerosis (ICD10-I70.0). Electronically Signed   By: Rockey Kilts M.D.   On: 11/29/2023 15:48   DG Shoulder Right Result Date: 11/29/2023 CLINICAL DATA:  Fall. EXAM: RIGHT SHOULDER - 2+ VIEW COMPARISON:  None Available. FINDINGS: No acute osseous or joint abnormality. Visualized right chest is unremarkable. IMPRESSION: No acute findings. Electronically Signed   By: Newell Eke M.D.   On: 11/29/2023 15:48   CT Cervical Spine Wo Contrast Result Date: 11/29/2023 CLINICAL DATA:  Neck trauma (Age >= 65y) Unwitnessed fall. EXAM: CT CERVICAL SPINE WITHOUT CONTRAST TECHNIQUE: Multidetector CT imaging of the cervical spine was performed without intravenous contrast. Multiplanar CT image reconstructions were also generated. RADIATION DOSE REDUCTION: This exam was performed according to the departmental dose-optimization program which includes automated exposure control, adjustment of the mA and/or kV according to patient size and/or use of iterative reconstruction technique. COMPARISON:  None Available. FINDINGS: Alignment: Normal. Skull base and vertebrae: No  acute fracture. Vertebral body heights are maintained. The dens and skull base are intact. Degenerative pannus at C1-C2. Soft tissues and spinal canal: No prevertebral fluid or swelling. No visible canal hematoma. Disc levels: Mild for age diffuse degenerative disc disease and facet hypertrophy. Upper chest: No acute findings. Calcified granuloma in both lung apices. Patulous esophagus. Other: Soft tissue edema posterior to the right shoulder. IMPRESSION: 1. No  acute fracture or subluxation of the cervical spine. 2. Soft tissue edema posterior to the right shoulder. Electronically Signed   By: Andrea Gasman M.D.   On: 11/29/2023 15:11   CT HEAD WO CONTRAST ( ) Result Date: 11/29/2023 CLINICAL DATA:  Unwitnessed fall. EXAM: CT HEAD WITHOUT CONTRAST TECHNIQUE: Contiguous axial images were obtained from the base of the skull through the vertex without intravenous contrast. RADIATION DOSE REDUCTION: This exam was performed according to the departmental dose-optimization program which includes automated exposure control, adjustment of the mA and/or kV according to patient size and/or use of iterative reconstruction technique. COMPARISON:  Head CT 05/05/2015 FINDINGS: Brain: No intracranial hemorrhage, mass effect, or midline shift. Diffuse atrophy, temporal lobe predominant, with progression from 2017. No hydrocephalus. The basilar cisterns are patent. Periventricular deep white matter hypodensity typical of chronic small vessel ischemia. No evidence of territorial infarct or acute ischemia. No extra-axial or intracranial fluid collection. Vascular: Atherosclerosis of skullbase vasculature without hyperdense vessel or abnormal calcification. Skull: No fracture or focal lesion. Sinuses/Orbits: Paranasal sinuses and mastoid air cells are clear. The visualized orbits are unremarkable. Bilateral cataract resection Other: Posterior scalp hematoma. IMPRESSION: 1. Posterior scalp hematoma. No acute intracranial abnormality. No skull fracture. 2. Diffuse atrophy, temporal lobe predominant, with progression from 2017. Chronic small vessel ischemia. Electronically Signed   By: Andrea Gasman M.D.   On: 11/29/2023 15:07   DG Pelvis Portable Result Date: 11/29/2023 CLINICAL DATA:  Unwitnessed fall.  Two falls in the last 2 days. EXAM: PORTABLE PELVIS 1-2 VIEWS COMPARISON:  None Available. FINDINGS: No evidence of acute fracture. No hip fracture. Advanced right hip arthropathy  with complete joint space loss, subchondral sclerosis and cystic change. Mild to moderate left hip arthropathy with acetabular spurring and subchondral cysts. Pubic rami are intact. Degenerative change of the pubic symphysis and sacroiliac joints. Probable prior bladder tack. Vascular calcifications in the pelvis. IMPRESSION: 1. No acute fracture or dislocation. 2. Advanced right and mild to moderate left hip arthropathy. Electronically Signed   By: Andrea Gasman M.D.   On: 11/29/2023 14:55      Labs: BNP (last 3 results) No results for input(s): BNP in the last 8760 hours. Basic Metabolic Panel: Recent Labs  Lab 11/29/23 1310 11/30/23 0455  NA 137 139  K 3.9 3.3*  CL 103 104  CO2 26 27  GLUCOSE 122* 94  BUN 17 13  CREATININE 0.65 0.52  CALCIUM 8.7* 8.2*   Liver Function Tests: Recent Labs  Lab 11/29/23 1310  AST 25  ALT 16  ALKPHOS 67  BILITOT 0.8  PROT 6.6  ALBUMIN 3.6   No results for input(s): LIPASE, AMYLASE in the last 168 hours. No results for input(s): AMMONIA in the last 168 hours. CBC: Recent Labs  Lab 11/29/23 1310 11/30/23 0455  WBC 10.4 7.6  NEUTROABS 8.9*  --   HGB 11.3* 10.0*  HCT 34.8* 31.1*  MCV 92.1 92.3  PLT 267 241   Cardiac Enzymes: Recent Labs  Lab 11/29/23 1310 11/30/23 0455  CKTOTAL 335* 380*   BNP: Invalid input(s): POCBNP CBG: No  results for input(s): GLUCAP in the last 168 hours. D-Dimer No results for input(s): DDIMER in the last 72 hours. Hgb A1c No results for input(s): HGBA1C in the last 72 hours. Lipid Profile No results for input(s): CHOL, HDL, LDLCALC, TRIG, CHOLHDL, LDLDIRECT in the last 72 hours. Thyroid  function studies No results for input(s): TSH, T4TOTAL, T3FREE, THYROIDAB in the last 72 hours.  Invalid input(s): FREET3 Anemia work up No results for input(s): VITAMINB12, FOLATE, FERRITIN, TIBC, IRON, RETICCTPCT in the last 72 hours. Urinalysis     Component Value Date/Time   COLORURINE YELLOW (A) 11/29/2023 1521   APPEARANCEUR CLEAR (A) 11/29/2023 1521   APPEARANCEUR Clear 12/08/2013 0926   LABSPEC 1.014 11/29/2023 1521   LABSPEC 1.019 12/08/2013 0926   PHURINE 5.0 11/29/2023 1521   GLUCOSEU NEGATIVE 11/29/2023 1521   GLUCOSEU Negative 12/08/2013 0926   HGBUR NEGATIVE 11/29/2023 1521   BILIRUBINUR NEGATIVE 11/29/2023 1521   BILIRUBINUR Negative 12/08/2013 0926   KETONESUR 20 (A) 11/29/2023 1521   PROTEINUR NEGATIVE 11/29/2023 1521   NITRITE NEGATIVE 11/29/2023 1521   LEUKOCYTESUR NEGATIVE 11/29/2023 1521   LEUKOCYTESUR Negative 12/08/2013 0926   Sepsis Labs Recent Labs  Lab 11/29/23 1310 11/30/23 0455  WBC 10.4 7.6   Microbiology Recent Results (from the past 240 hours)  Urine Culture     Status: None   Collection Time: 11/29/23  3:21 PM   Specimen: Urine, Clean Catch  Result Value Ref Range Status   Specimen Description   Final    URINE, CLEAN CATCH Performed at [facility], [address], [place], [place] [address]    Special Requests   Final    NONE Performed at [facility], [address], [place], [place] [address]    Culture   Final    NO GROWTH Performed at [facility], [address], [place], [place] [address]    Report Status 11/30/2023 FINAL  Final     Total time spend on discharging this patient, including the last patient exam, discussing the hospital stay, instructions for ongoing care as it relates to all pertinent caregivers, as well as preparing the medical discharge records, prescriptions, and/or referrals as applicable, is 30 minutes.    Ellouise Haber, MD  [facility] 12/04/2023, 12:27 PM

## 2023-12-04 NOTE — Plan of Care (Signed)
  Problem: Education: Goal: Knowledge of General Education information will improve Description: Including pain rating scale, medication(s)/side effects and non-pharmacologic comfort measures Outcome: Progressing   Problem: Health Behavior/Discharge Planning: Goal: Ability to manage health-related needs will improve Outcome: Progressing   Problem: Clinical Measurements: Goal: Will remain free from infection Outcome: Progressing Goal: Diagnostic test results will improve Outcome: Progressing   Problem: Nutrition: Goal: Adequate nutrition will be maintained Outcome: Progressing   Problem: Safety: Goal: Ability to remain free from injury will improve Outcome: Progressing   Problem: Skin Integrity: Goal: Risk for impaired skin integrity will decrease Outcome: Progressing

## 2023-12-04 NOTE — Progress Notes (Signed)
 Patient discharge via ambulance transport service, 2 staff.  Vss 146/68, 97% room air, hr 78bpm, respirations 18, tempt 97.8.  patient is alert but confused, foley catheter in place, bag drained, 450 clear yellow urine.  Receiving facility and patient's son was call to confirm discharge 12/04/23 pm.

## 2023-12-04 NOTE — Progress Notes (Signed)
 Patient has not urinated this shift, bladder scan reveals [phone] in the bladder. Scanned twice.

## 2023-12-04 NOTE — Plan of Care (Signed)

## 2023-12-04 NOTE — Plan of Care (Signed)
  Problem: Education: Goal: Knowledge of General Education information will improve Description: Including pain rating scale, medication(s)/side effects and non-pharmacologic comfort measures 12/04/2023 1756 by Rosalio Tully SAILOR, RN Outcome: Adequate for Discharge 12/04/2023 1430 by Rosalio Tully SAILOR, RN Outcome: Progressing   Problem: Health Behavior/Discharge Planning: Goal: Ability to manage health-related needs will improve 12/04/2023 1756 by Rosalio Tully SAILOR, RN Outcome: Adequate for Discharge 12/04/2023 1430 by Rosalio Tully SAILOR, RN Outcome: Progressing   Problem: Clinical Measurements: Goal: Ability to maintain clinical measurements within normal limits will improve 12/04/2023 1756 by Rosalio Tully SAILOR, RN Outcome: Adequate for Discharge 12/04/2023 1430 by Rosalio Tully SAILOR, RN Outcome: Progressing Goal: Will remain free from infection 12/04/2023 1756 by Rosalio Tully SAILOR, RN Outcome: Adequate for Discharge 12/04/2023 1430 by Rosalio Tully SAILOR, RN Outcome: Progressing Goal: Diagnostic test results will improve 12/04/2023 1756 by Rosalio Tully SAILOR, RN Outcome: Adequate for Discharge 12/04/2023 1430 by Rosalio Tully SAILOR, RN Outcome: Progressing Goal: Respiratory complications will improve 12/04/2023 1756 by Rosalio Tully SAILOR, RN Outcome: Adequate for Discharge 12/04/2023 1430 by Rosalio Tully SAILOR, RN Outcome: Progressing Goal: Cardiovascular complication will be avoided 12/04/2023 1756 by Rosalio Tully SAILOR, RN Outcome: Adequate for Discharge 12/04/2023 1430 by Rosalio Tully SAILOR, RN Outcome: Progressing   Problem: Activity: Goal: Risk for activity intolerance will decrease 12/04/2023 1756 by Rosalio Tully SAILOR, RN Outcome: Adequate for Discharge 12/04/2023 1430 by Rosalio Tully SAILOR, RN Outcome: Progressing   Problem: Nutrition: Goal: Adequate nutrition will be maintained 12/04/2023 1756 by Rosalio Tully SAILOR, RN Outcome: Adequate for Discharge 12/04/2023 1430 by Rosalio Tully SAILOR, RN Outcome: Progressing    Problem: Coping: Goal: Level of anxiety will decrease 12/04/2023 1756 by Rosalio Tully SAILOR, RN Outcome: Adequate for Discharge 12/04/2023 1430 by Rosalio Tully SAILOR, RN Outcome: Progressing   Problem: Elimination: Goal: Will not experience complications related to bowel motility 12/04/2023 1756 by Rosalio Tully SAILOR, RN Outcome: Adequate for Discharge 12/04/2023 1430 by Rosalio Tully SAILOR, RN Outcome: Progressing Goal: Will not experience complications related to urinary retention 12/04/2023 1756 by Rosalio Tully SAILOR, RN Outcome: Adequate for Discharge 12/04/2023 1430 by Rosalio Tully SAILOR, RN Outcome: Progressing   Problem: Pain Managment: Goal: General experience of comfort will improve and/or be controlled 12/04/2023 1756 by Rosalio Tully SAILOR, RN Outcome: Adequate for Discharge 12/04/2023 1430 by Rosalio Tully SAILOR, RN Outcome: Progressing   Problem: Safety: Goal: Ability to remain free from injury will improve 12/04/2023 1756 by Rosalio Tully SAILOR, RN Outcome: Adequate for Discharge 12/04/2023 1430 by Rosalio Tully SAILOR, RN Outcome: Progressing   Problem: Skin Integrity: Goal: Risk for impaired skin integrity will decrease 12/04/2023 1756 by Rosalio Tully SAILOR, RN Outcome: Adequate for Discharge 12/04/2023 1430 by Rosalio Tully SAILOR, RN Outcome: Progressing

## 2023-12-04 NOTE — TOC Transition Note (Signed)
 Transition of Care [facility]) - Discharge Note   Patient Details  Name: Kathy Hernandez MRN: [id number] Date of Birth: [birth year]  Transition [facility] CM/SW Contact:  Marinda Cooks, RN Phone Number: 12/04/2023, 6:21 PM   Clinical Narrative:     This CM updated by covering MD pt medically cleared to dc today and has active DC order . This CM spoke with Admission liaison with [facility] and confirmed everything was coordinated including DME Also confirmed with admission nurse Dana at [facility] everything was coordinated and pt can be received back today. New FL2 completed and signed by MD and emailed over to facility to Dana prior to pt discharging . DC transportation confirmed for pt with [facility] team updated . No additional DC needs requested by medical team or identified by CM at this time .    Final next level of care: Assisted Living [facility]) Barriers to Discharge: No Barriers Identified   Discharge Placement PASRR number recieved: 12/04/23              Patient to be transferred to facility by: [facility] Name of family member notified: Son Patient and family notified of of transfer: 12/04/23  Discharge Plan and Services Additional resources added to the After Visit Summary for                              [facility] Agency: Other - See comment [facility]) Date [facility] Agency Contacted: 12/03/23 Time HH Agency Contacted: 1000 Representative spoke with at [facility] Agency: Daphne Shed  Social Drivers of Health (SDOH) Interventions SDOH Screenings   Food Insecurity: No Food Insecurity (11/29/2023)  Housing: Low Risk  (11/29/2023)  Transportation Needs: No Transportation Needs (11/29/2023)  Utilities: Not At Risk (11/29/2023)  Social Connections: Socially Isolated (11/29/2023)  Tobacco Use: Low Risk  (11/29/2023)     Readmission Risk Interventions     No data to display

## 2023-12-15 DIAGNOSIS — R339 Retention of urine, unspecified: Secondary | ICD-10-CM | POA: Insufficient documentation

## 2023-12-15 NOTE — Progress Notes (Deleted)
   12/15/23 11:27 AM   Kathy Hernandez [birth year] [id number]  CC: urinary retention / indwelling catheter   HPI: 89 year old female here for initial evaluation of urinary retention.  Recent admission, AMS / dementia / falls  AUR noted during admit - ~700-800cc Foley placed 12/04/23   Patient / family - elected discharge w/ home Hospice, palliative   PMH: Past Medical History:  Diagnosis Date   GERD (gastroesophageal reflux disease)    Hypertension    Memory loss     Surgical History: Past Surgical History:  Procedure Laterality Date   CHOLECYSTECTOMY     COLONOSCOPY  2010   HERNIA REPAIR  2012    Family History: Family History  Problem Relation Age of Onset   Tuberculosis Mother     Social History:  reports that she has never smoked. She has never used smokeless tobacco. She reports that she does not drink alcohol and does not use drugs.  Physical Exam: There were no vitals taken for this visit.   Constitutional:  Alert and oriented, No acute distress. Cardiovascular: No clubbing, cyanosis, or edema. Respiratory: Normal respiratory effort, no increased work of breathing. GI: Nondistended GU: *** Skin: No rashes, bruises or suspicious lesions. Neurologic: Grossly intact, no focal deficits, moving all 4 extremities. Psychiatric: Normal mood and affect.  Laboratory Data:  Latest Reference Range & Units 11/30/23 04:55  Creatinine 0.44 - 1.00 mg/dL 9.47    91/90/74 84:78  Urine Culture negative    Pertinent Imaging: N/A  Assessment & Plan:    Urinary retention Assessment & Plan: AUR (dx 12/04/23) during admission Indwelling Foley  Home hospice / palliative        Penne Skye, MD 12/15/2023  [facility] [address], [address] [place], [place] [address] [phone]

## 2023-12-15 NOTE — Assessment & Plan Note (Deleted)
 AUR (dx 12/04/23) during admission Indwelling Foley  Home hospice / palliative

## 2023-12-18 ENCOUNTER — Ambulatory Visit: Admitting: Urology

## 2023-12-25 ENCOUNTER — Ambulatory Visit: Admitting: Physician Assistant

## 2023-12-25 ENCOUNTER — Ambulatory Visit: Admitting: Urology

## 2023-12-25 DIAGNOSIS — R339 Retention of urine, unspecified: Secondary | ICD-10-CM

## 2024-01-21 DEATH — deceased
